# Patient Record
Sex: Female | Born: 1940 | Race: White | Hispanic: No | Marital: Married | Smoking: Former smoker
Health system: Southern US, Community
[De-identification: ages and names within clinical notes are randomized; demographics above are authoritative.]

## PROBLEM LIST (undated history)

## (undated) DIAGNOSIS — N289 Disorder of kidney and ureter, unspecified: Secondary | ICD-10-CM

## (undated) DIAGNOSIS — G629 Polyneuropathy, unspecified: Secondary | ICD-10-CM

## (undated) DIAGNOSIS — I10 Essential (primary) hypertension: Secondary | ICD-10-CM

## (undated) DIAGNOSIS — E785 Hyperlipidemia, unspecified: Secondary | ICD-10-CM

## (undated) DIAGNOSIS — M4800 Spinal stenosis, site unspecified: Secondary | ICD-10-CM

## (undated) HISTORY — PX: LITHOTRIPSY: SUR834

## (undated) HISTORY — DX: Polyneuropathy, unspecified: G62.9

## (undated) HISTORY — PX: CATARACT EXTRACTION: SUR2

## (undated) HISTORY — DX: Spinal stenosis, site unspecified: M48.00

## (undated) HISTORY — DX: Essential (primary) hypertension: I10

## (undated) HISTORY — DX: Hyperlipidemia, unspecified: E78.5

## (undated) HISTORY — PX: VAGINAL HYSTERECTOMY: SUR661

---

## 2014-06-09 DIAGNOSIS — M71152 Other infective bursitis, left hip: Secondary | ICD-10-CM | POA: Diagnosis not present

## 2014-06-09 DIAGNOSIS — M1612 Unilateral primary osteoarthritis, left hip: Secondary | ICD-10-CM | POA: Diagnosis not present

## 2014-06-09 DIAGNOSIS — H02831 Dermatochalasis of right upper eyelid: Secondary | ICD-10-CM | POA: Diagnosis not present

## 2014-06-09 DIAGNOSIS — H029 Unspecified disorder of eyelid: Secondary | ICD-10-CM | POA: Diagnosis not present

## 2014-06-09 DIAGNOSIS — H02834 Dermatochalasis of left upper eyelid: Secondary | ICD-10-CM | POA: Diagnosis not present

## 2014-06-14 DIAGNOSIS — M25551 Pain in right hip: Secondary | ICD-10-CM | POA: Diagnosis not present

## 2014-06-14 DIAGNOSIS — R2681 Unsteadiness on feet: Secondary | ICD-10-CM | POA: Diagnosis not present

## 2014-06-14 DIAGNOSIS — M7062 Trochanteric bursitis, left hip: Secondary | ICD-10-CM | POA: Diagnosis not present

## 2014-06-14 DIAGNOSIS — M6281 Muscle weakness (generalized): Secondary | ICD-10-CM | POA: Diagnosis not present

## 2014-06-23 DIAGNOSIS — R2681 Unsteadiness on feet: Secondary | ICD-10-CM | POA: Diagnosis not present

## 2014-06-23 DIAGNOSIS — M25551 Pain in right hip: Secondary | ICD-10-CM | POA: Diagnosis not present

## 2014-06-23 DIAGNOSIS — M7062 Trochanteric bursitis, left hip: Secondary | ICD-10-CM | POA: Diagnosis not present

## 2014-06-23 DIAGNOSIS — M6281 Muscle weakness (generalized): Secondary | ICD-10-CM | POA: Diagnosis not present

## 2014-06-28 DIAGNOSIS — R2681 Unsteadiness on feet: Secondary | ICD-10-CM | POA: Diagnosis not present

## 2014-06-28 DIAGNOSIS — M6281 Muscle weakness (generalized): Secondary | ICD-10-CM | POA: Diagnosis not present

## 2014-06-28 DIAGNOSIS — M25551 Pain in right hip: Secondary | ICD-10-CM | POA: Diagnosis not present

## 2014-06-28 DIAGNOSIS — M7062 Trochanteric bursitis, left hip: Secondary | ICD-10-CM | POA: Diagnosis not present

## 2014-06-29 DIAGNOSIS — J189 Pneumonia, unspecified organism: Secondary | ICD-10-CM | POA: Diagnosis not present

## 2014-06-29 DIAGNOSIS — J209 Acute bronchitis, unspecified: Secondary | ICD-10-CM | POA: Diagnosis not present

## 2014-06-29 DIAGNOSIS — J449 Chronic obstructive pulmonary disease, unspecified: Secondary | ICD-10-CM | POA: Diagnosis not present

## 2014-07-07 DIAGNOSIS — R2681 Unsteadiness on feet: Secondary | ICD-10-CM | POA: Diagnosis not present

## 2014-07-07 DIAGNOSIS — M7062 Trochanteric bursitis, left hip: Secondary | ICD-10-CM | POA: Diagnosis not present

## 2014-07-07 DIAGNOSIS — M25551 Pain in right hip: Secondary | ICD-10-CM | POA: Diagnosis not present

## 2014-07-07 DIAGNOSIS — M6281 Muscle weakness (generalized): Secondary | ICD-10-CM | POA: Diagnosis not present

## 2014-07-14 DIAGNOSIS — R2681 Unsteadiness on feet: Secondary | ICD-10-CM | POA: Diagnosis not present

## 2014-07-14 DIAGNOSIS — M7062 Trochanteric bursitis, left hip: Secondary | ICD-10-CM | POA: Diagnosis not present

## 2014-07-14 DIAGNOSIS — M6281 Muscle weakness (generalized): Secondary | ICD-10-CM | POA: Diagnosis not present

## 2014-07-14 DIAGNOSIS — M25551 Pain in right hip: Secondary | ICD-10-CM | POA: Diagnosis not present

## 2014-07-19 DIAGNOSIS — R2681 Unsteadiness on feet: Secondary | ICD-10-CM | POA: Diagnosis not present

## 2014-07-19 DIAGNOSIS — M7062 Trochanteric bursitis, left hip: Secondary | ICD-10-CM | POA: Diagnosis not present

## 2014-07-19 DIAGNOSIS — M6281 Muscle weakness (generalized): Secondary | ICD-10-CM | POA: Diagnosis not present

## 2014-07-19 DIAGNOSIS — M25551 Pain in right hip: Secondary | ICD-10-CM | POA: Diagnosis not present

## 2014-07-22 DIAGNOSIS — M7062 Trochanteric bursitis, left hip: Secondary | ICD-10-CM | POA: Diagnosis not present

## 2014-07-22 DIAGNOSIS — M25551 Pain in right hip: Secondary | ICD-10-CM | POA: Diagnosis not present

## 2014-07-22 DIAGNOSIS — M6281 Muscle weakness (generalized): Secondary | ICD-10-CM | POA: Diagnosis not present

## 2014-07-22 DIAGNOSIS — R2681 Unsteadiness on feet: Secondary | ICD-10-CM | POA: Diagnosis not present

## 2014-07-27 HISTORY — PX: EYE SURGERY: SHX253

## 2014-07-28 DIAGNOSIS — M7062 Trochanteric bursitis, left hip: Secondary | ICD-10-CM | POA: Diagnosis not present

## 2014-07-28 DIAGNOSIS — R2681 Unsteadiness on feet: Secondary | ICD-10-CM | POA: Diagnosis not present

## 2014-07-28 DIAGNOSIS — M6281 Muscle weakness (generalized): Secondary | ICD-10-CM | POA: Diagnosis not present

## 2014-07-28 DIAGNOSIS — M25551 Pain in right hip: Secondary | ICD-10-CM | POA: Diagnosis not present

## 2014-08-02 DIAGNOSIS — M7062 Trochanteric bursitis, left hip: Secondary | ICD-10-CM | POA: Diagnosis not present

## 2014-08-02 DIAGNOSIS — R2681 Unsteadiness on feet: Secondary | ICD-10-CM | POA: Diagnosis not present

## 2014-08-02 DIAGNOSIS — M6281 Muscle weakness (generalized): Secondary | ICD-10-CM | POA: Diagnosis not present

## 2014-08-02 DIAGNOSIS — M25551 Pain in right hip: Secondary | ICD-10-CM | POA: Diagnosis not present

## 2014-08-03 DIAGNOSIS — H04562 Stenosis of left lacrimal punctum: Secondary | ICD-10-CM | POA: Diagnosis not present

## 2014-08-03 DIAGNOSIS — H029 Unspecified disorder of eyelid: Secondary | ICD-10-CM | POA: Diagnosis not present

## 2014-08-04 DIAGNOSIS — L728 Other follicular cysts of the skin and subcutaneous tissue: Secondary | ICD-10-CM | POA: Diagnosis not present

## 2014-08-09 DIAGNOSIS — R2681 Unsteadiness on feet: Secondary | ICD-10-CM | POA: Diagnosis not present

## 2014-08-09 DIAGNOSIS — M25551 Pain in right hip: Secondary | ICD-10-CM | POA: Diagnosis not present

## 2014-08-09 DIAGNOSIS — M7062 Trochanteric bursitis, left hip: Secondary | ICD-10-CM | POA: Diagnosis not present

## 2014-08-09 DIAGNOSIS — M6281 Muscle weakness (generalized): Secondary | ICD-10-CM | POA: Diagnosis not present

## 2014-08-11 DIAGNOSIS — R2681 Unsteadiness on feet: Secondary | ICD-10-CM | POA: Diagnosis not present

## 2014-08-11 DIAGNOSIS — M6281 Muscle weakness (generalized): Secondary | ICD-10-CM | POA: Diagnosis not present

## 2014-08-11 DIAGNOSIS — M25551 Pain in right hip: Secondary | ICD-10-CM | POA: Diagnosis not present

## 2014-08-11 DIAGNOSIS — M7062 Trochanteric bursitis, left hip: Secondary | ICD-10-CM | POA: Diagnosis not present

## 2014-08-16 DIAGNOSIS — M25551 Pain in right hip: Secondary | ICD-10-CM | POA: Diagnosis not present

## 2014-08-16 DIAGNOSIS — M7062 Trochanteric bursitis, left hip: Secondary | ICD-10-CM | POA: Diagnosis not present

## 2014-08-16 DIAGNOSIS — M6281 Muscle weakness (generalized): Secondary | ICD-10-CM | POA: Diagnosis not present

## 2014-08-16 DIAGNOSIS — R2681 Unsteadiness on feet: Secondary | ICD-10-CM | POA: Diagnosis not present

## 2014-08-24 DIAGNOSIS — M719 Bursopathy, unspecified: Secondary | ICD-10-CM | POA: Diagnosis not present

## 2014-09-23 DIAGNOSIS — H04562 Stenosis of left lacrimal punctum: Secondary | ICD-10-CM | POA: Diagnosis not present

## 2014-09-23 DIAGNOSIS — H029 Unspecified disorder of eyelid: Secondary | ICD-10-CM | POA: Diagnosis not present

## 2014-09-27 DIAGNOSIS — E109 Type 1 diabetes mellitus without complications: Secondary | ICD-10-CM | POA: Diagnosis not present

## 2014-09-27 DIAGNOSIS — I10 Essential (primary) hypertension: Secondary | ICD-10-CM | POA: Diagnosis not present

## 2014-09-27 DIAGNOSIS — R739 Hyperglycemia, unspecified: Secondary | ICD-10-CM | POA: Diagnosis not present

## 2014-09-27 DIAGNOSIS — Z79899 Other long term (current) drug therapy: Secondary | ICD-10-CM | POA: Diagnosis not present

## 2014-09-30 DIAGNOSIS — E785 Hyperlipidemia, unspecified: Secondary | ICD-10-CM | POA: Diagnosis not present

## 2014-09-30 DIAGNOSIS — R739 Hyperglycemia, unspecified: Secondary | ICD-10-CM | POA: Diagnosis not present

## 2014-09-30 DIAGNOSIS — N289 Disorder of kidney and ureter, unspecified: Secondary | ICD-10-CM | POA: Diagnosis not present

## 2014-09-30 DIAGNOSIS — I10 Essential (primary) hypertension: Secondary | ICD-10-CM | POA: Diagnosis not present

## 2014-09-30 DIAGNOSIS — J40 Bronchitis, not specified as acute or chronic: Secondary | ICD-10-CM | POA: Diagnosis not present

## 2014-12-23 DIAGNOSIS — M546 Pain in thoracic spine: Secondary | ICD-10-CM | POA: Diagnosis not present

## 2015-02-10 ENCOUNTER — Ambulatory Visit (INDEPENDENT_AMBULATORY_CARE_PROVIDER_SITE_OTHER): Payer: Medicare Other | Admitting: Family Medicine

## 2015-02-10 ENCOUNTER — Encounter: Payer: Self-pay | Admitting: Family Medicine

## 2015-02-10 VITALS — BP 138/80 | HR 86 | Ht 67.0 in | Wt 156.0 lb

## 2015-02-10 DIAGNOSIS — E785 Hyperlipidemia, unspecified: Secondary | ICD-10-CM

## 2015-02-10 DIAGNOSIS — I10 Essential (primary) hypertension: Secondary | ICD-10-CM | POA: Diagnosis not present

## 2015-02-10 DIAGNOSIS — M4806 Spinal stenosis, lumbar region: Secondary | ICD-10-CM | POA: Diagnosis not present

## 2015-02-10 DIAGNOSIS — M5090 Cervical disc disorder, unspecified, unspecified cervical region: Secondary | ICD-10-CM | POA: Diagnosis not present

## 2015-02-10 DIAGNOSIS — M48061 Spinal stenosis, lumbar region without neurogenic claudication: Secondary | ICD-10-CM

## 2015-02-10 DIAGNOSIS — M8580 Other specified disorders of bone density and structure, unspecified site: Secondary | ICD-10-CM

## 2015-02-10 MED ORDER — GABAPENTIN 300 MG PO CAPS: 300.0000 mg | ORAL_CAPSULE | Freq: Every day | ORAL | Status: DC

## 2015-02-10 MED ORDER — AMLODIPINE BESYLATE-VALSARTAN 10-160 MG PO TABS: 1.0000 | ORAL_TABLET | Freq: Every day | ORAL | Status: DC

## 2015-02-10 MED ORDER — PRAVASTATIN SODIUM 80 MG PO TABS: 80.0000 mg | ORAL_TABLET | Freq: Every day | ORAL | Status: DC

## 2015-02-10 NOTE — Progress Notes (Signed)
Name: Traci Bowen   MRN: [id number]    DOB: 03/08/1941   Date:02/10/2015       Progress Note  Subjective  Chief Complaint  Chief Complaint  Patient presents with  . Establish Care    moved back to area    Anxiety Onset was 6 to 12 months ago. The problem has been gradually improving. Symptoms include depressed mood, excessive worry and nervous/anxious behavior. Patient reports no chest pain, compulsions, confusion, decreased concentration, dizziness, dry mouth, feeling of choking, hyperventilation, impotence, insomnia, irritability, malaise, muscle tension, nausea, obsessions, palpitations, panic, restlessness, shortness of breath or suicidal ideas. Symptoms occur most days. The severity of symptoms is mild. The symptoms are aggravated by family issues.   Risk factors include a major life event. There is no history of suicide attempts. Past treatments include nothing.  Hypertension This is a chronic problem. The current episode started more than 1 year ago. The problem has been gradually improving since onset. The problem is controlled. Associated symptoms include anxiety. Pertinent negatives include no blurred vision, chest pain, headaches, malaise/fatigue, neck pain, orthopnea, palpitations, peripheral edema, PND, shortness of breath or sweats. Past treatments include ACE inhibitors and calcium channel blockers. There are no compliance problems.  There is no history of angina, kidney disease, CAD/MI, CVA, heart failure, left ventricular hypertrophy, PVD, renovascular disease or retinopathy. There is no history of chronic renal disease.  Hyperlipidemia This is a chronic problem. The current episode started more than 1 year ago. The problem is controlled. She has no history of chronic renal disease. Pertinent negatives include no chest pain, focal weakness, myalgias or shortness of breath. Current antihyperlipidemic treatment includes diet change and statins. There are no compliance problems.    Neck Pain  This is a chronic problem. The current episode started more than 1 year ago. The problem has been waxing and waning. Associated with: s/p surgery. The pain is present in the right side. The quality of the pain is described as burning. Pertinent negatives include no chest pain, fever, headaches, paresis, tingling or weight loss.    No problem-specific assessment & plan notes found for this encounter.   Past Medical History  Diagnosis Date  . Spinal stenosis   . Hyperlipidemia   . Neuropathy   . Hypertension     Past Surgical History  Procedure Laterality Date  . Vaginal hysterectomy    . Cataract extraction Bilateral     Family History  Problem Relation Age of Onset  . Diabetes Mother   . Hypertension Mother   . Cancer Father   . Cancer Brother     Social History   Social History  . Marital Status: Widowed    Spouse Name: N/A  . Number of Children: N/A  . Years of Education: N/A   Occupational History  . Not on file.   Social History Main Topics  . Smoking status: Former Research scientist (life sciences)  . Smokeless tobacco: Not on file  . Alcohol Use: No  . Drug Use: No  . Sexual Activity: No   Other Topics Concern  . Not on file   Social History Narrative  . No narrative on file    Allergies  Allergen Reactions  . Iodinated Diagnostic Agents   . Penicillins      Review of Systems  Constitutional: Negative for fever, chills, weight loss, malaise/fatigue and irritability.  HENT: Negative for ear discharge, ear pain and sore throat.   Eyes: Negative for blurred vision.  Respiratory: Negative for cough, sputum  production, shortness of breath and wheezing.   Cardiovascular: Negative for chest pain, palpitations, orthopnea, leg swelling and PND.  Gastrointestinal: Negative for heartburn, nausea, abdominal pain, diarrhea, constipation, blood in stool and melena.  Genitourinary: Negative for dysuria, urgency, frequency, hematuria and impotence.  Musculoskeletal:  Negative for myalgias, back pain, joint pain and neck pain.  Skin: Negative for rash.  Neurological: Negative for dizziness, tingling, sensory change, focal weakness and headaches.  Endo/Heme/Allergies: Negative for environmental allergies and polydipsia. Does not bruise/bleed easily.  Psychiatric/Behavioral: Negative for depression, suicidal ideas, confusion and decreased concentration. The patient is nervous/anxious. The patient does not have insomnia.      Objective  Filed Vitals:   02/10/15 1417  BP: 138/80  Pulse: 86  Height: 5\' 7"  (1.702 m)  Weight: 156 lb (70.761 kg)    Physical Exam  Constitutional: She is well-developed, well-nourished, and in no distress. No distress.  HENT:  Head: Normocephalic and atraumatic.  Right Ear: External ear normal.  Left Ear: External ear normal.  Nose: Nose normal.  Mouth/Throat: Oropharynx is clear and moist.  Eyes: Conjunctivae and EOM are normal. Pupils are equal, round, and reactive to light. Right eye exhibits no discharge. Left eye exhibits no discharge.  Neck: Normal range of motion. Neck supple. No JVD present. No thyromegaly present.  Cardiovascular: Normal rate, regular rhythm, normal heart sounds and intact distal pulses.  Exam reveals no gallop and no friction rub.   No murmur heard. Pulmonary/Chest: Effort normal and breath sounds normal.  Abdominal: Soft. Bowel sounds are normal. She exhibits no mass. There is no tenderness. There is no guarding.  Musculoskeletal: Normal range of motion. She exhibits no edema.  Lymphadenopathy:    She has no cervical adenopathy.  Neurological: She is alert. She has normal reflexes.  Skin: Skin is warm and dry. She is not diaphoretic.  Psychiatric: Mood and affect normal.      Assessment & Plan  Problem List Items Addressed This Visit    None    Visit Diagnoses    Essential hypertension    -  Primary    Relevant Medications    amLODipine-valsartan (EXFORGE) 10-160 MG per tablet     pravastatin (PRAVACHOL) 80 MG tablet    Hyperlipidemia        Relevant Medications    amLODipine-valsartan (EXFORGE) 10-160 MG per tablet    pravastatin (PRAVACHOL) 80 MG tablet    Spinal stenosis of lumbar region        Relevant Medications    gabapentin (NEURONTIN) 300 MG capsule    Cervical disc disease        Osteopenia             Dr. Trystian Crisanto [facility]  02/10/2015

## 2015-03-28 DIAGNOSIS — H02831 Dermatochalasis of right upper eyelid: Secondary | ICD-10-CM | POA: Diagnosis not present

## 2015-03-28 DIAGNOSIS — H02834 Dermatochalasis of left upper eyelid: Secondary | ICD-10-CM | POA: Diagnosis not present

## 2015-03-28 DIAGNOSIS — H029 Unspecified disorder of eyelid: Secondary | ICD-10-CM | POA: Diagnosis not present

## 2015-04-08 DIAGNOSIS — Z23 Encounter for immunization: Secondary | ICD-10-CM | POA: Diagnosis not present

## 2015-04-27 ENCOUNTER — Encounter: Payer: Self-pay | Admitting: Emergency Medicine

## 2015-04-27 ENCOUNTER — Ambulatory Visit
Admission: EM | Admit: 2015-04-27 | Discharge: 2015-04-27 | Disposition: A | Discharge status: Home / Self Care | Payer: Medicare Other | Attending: Family Medicine | Admitting: Family Medicine

## 2015-04-27 DIAGNOSIS — M7552 Bursitis of left shoulder: Secondary | ICD-10-CM

## 2015-04-27 HISTORY — DX: Disorder of kidney and ureter, unspecified: N28.9

## 2015-04-27 MED ORDER — MELOXICAM 7.5 MG PO TABS: 7.5000 mg | ORAL_TABLET | Freq: Every day | ORAL | Status: DC

## 2015-04-27 MED ORDER — TRAMADOL HCL 50 MG PO TABS: 50.0000 mg | ORAL_TABLET | Freq: Three times a day (TID) | ORAL | Status: DC | PRN

## 2015-04-27 NOTE — ED Notes (Signed)
Pt reports Left shoulder pain, bursitis, has had it before. Tender to touch. Has seen it an orthopedist before for this ,took some ibuprofen but didn't help.

## 2015-04-27 NOTE — ED Provider Notes (Signed)
[facility]  ____________________________________________  Time seen: Approximately 5:39 PM  I have reviewed the triage vital signs and the nursing notes.   HISTORY  Chief Complaint Shoulder Pain  HPI Traci Bowen is a 74 y.o. female presents for complaints of gradual onset of left shoulder pain x 4-5 days. States pain is point tender. States has had history of similar in past which required orthopedic injection for bursitis in left shoulder. States when not moving arm or touching arm, the shoulder does not hurt. Denies chest pain, abdominal pain, shortness of breath, weakness. Denies pain radiation. States the pain does not radiate to chest or arm. Denies numbness or tingling sensation. Denies decrease hand grips. States that pain was worse this morning when she got up. States pain now hurts for her to lift her arm up.  Again reports that when sitting still and not using her arm she does not have any pain.  Patient denies fall, trauma or direct injury.Reports history of similar in past. Denies history of falls at home.   States current pain is 5 out of 10 aching. Reports she is right-hand dominant. Again patient reports pain is fully reproducible by palpation and movement. States unrelieved by one dose of ibuprofen this am.  Primary care physician: Dr. Ronnald Ramp. Reports she has an appointment with Dr. Ronnald Ramp this coming Monday.   Past Medical History  Diagnosis Date  . Spinal stenosis   . Hyperlipidemia   . Neuropathy ([place])   . Hypertension   . Renal disorder   Left shoulder bursitis. Bilateral wrist bursitis.  There are no active problems to display for this patient.   Past Surgical History  Procedure Laterality Date  . Vaginal hysterectomy    . Cataract extraction Bilateral   . Lithotripsy    . Eye surgery Left march 2016    cysts removed     Current Outpatient Rx  Name  Route  Sig  Dispense  Refill  . amLODipine-valsartan (EXFORGE) 10-160 MG per tablet   Oral    Take 1 tablet by mouth daily.   30 tablet   6   . Calcium Carb-Cholecalciferol (CALCIUM PLUS VITAMIN D3) 600-800 MG-UNIT TABS   Oral   Take 1 capsule by mouth daily at 6 (six) AM.         . Cholecalciferol (VITAMIN D-3 PO)   Oral   Take 2 capsules by mouth daily at 6 (six) AM.         . gabapentin (NEURONTIN) 300 MG capsule   Oral   Take 1 capsule (300 mg total) by mouth at bedtime.   30 capsule   6   . Multiple Vitamins-Minerals (CENTRUM SILVER PO)   Oral   Take 1 capsule by mouth daily at 6 (six) AM.         . Omega-3 Fatty Acids (FISH OIL) 1000 MG CAPS   Oral   Take 1 capsule by mouth 2 (two) times daily.         . pravastatin (PRAVACHOL) 80 MG tablet   Oral   Take 1 tablet (80 mg total) by mouth at bedtime.   30 tablet   6   . vitamin B-12 (CYANOCOBALAMIN) 1000 MCG tablet   Oral   Take 1,000 mcg by mouth daily.           Allergies Iodinated diagnostic agents and Penicillins  Family History  Problem Relation Age of Onset  . Diabetes Mother   . Hypertension Mother   .  Cancer Father   . Cancer Brother     Social History Social History  Substance Use Topics  . Smoking status: Former Research scientist (life sciences)  . Smokeless tobacco: None  . Alcohol Use: No    Review of Systems Constitutional: No fever/chills Eyes: No visual changes. ENT: No sore throat. Cardiovascular: Denies chest pain. Respiratory: Denies shortness of breath. Gastrointestinal: No abdominal pain.  No nausea, no vomiting.  No diarrhea.  No constipation. Genitourinary: Negative for dysuria. Musculoskeletal: Negative for back pain. Left shoulder pain.  Skin: Negative for rash. Neurological: Negative for headaches, focal weakness or numbness.  10-point ROS otherwise negative.  ____________________________________________   PHYSICAL EXAM:  VITAL SIGNS: ED Triage Vitals  Enc Vitals Group     BP 04/27/15 1556 130/58 mmHg     Pulse Rate 04/27/15 1556 75     Resp 04/27/15 1556 18     Temp  04/27/15 1556 98 F (36.7 C)     Temp Source 04/27/15 1556 Oral     SpO2 04/27/15 1556 97 %     Weight 04/27/15 1556 157 lb (71.215 kg)     Height 04/27/15 1556 5' 6.5" (1.689 m)     Head Cir --      Peak Flow --      Pain Score 04/27/15 1600 5     Pain Loc --      Pain Edu? --      Excl. in [place]? --     Constitutional: Alert and oriented. Well appearing and in no acute distress. Eyes: Conjunctivae are normal. PERRL. EOMI. Head: Atraumatic.  Nose: No congestion/rhinnorhea.  Mouth/Throat: Mucous membranes are moist.   Neck: No stridor.  No cervical spine tenderness to palpation. Hematological/Lymphatic/Immunilogical: No cervical lymphadenopathy. Cardiovascular: Normal rate, regular rhythm. Grossly normal heart sounds.  Good peripheral circulation. Respiratory: Normal respiratory effort.  No retractions. Lungs CTAB.No wheezes, rales or rhonchi. Good air movement.  Gastrointestinal: Soft and nontender. No distention. Normal Bowel sounds.  No abdominal bruits. No CVA tenderness. Musculoskeletal: No lower or upper extremity tenderness nor edema.  No joint effusions. Bilateral pedal pulses equal and easily palpated.  Except: left anterior shoulder and proximal humeral head mod TTP, no ecchymosis, no swelling, no erythema, pain with abduction at 45 degrees, bilateral hand grips equal, distal radial pulses equal bilaterally, sensation intact to bilateral upper extremities, chest nontender, left upper extremity otherwise nontender.  Neurologic:  Normal speech and language. No gross focal neurologic deficits are appreciated. No gait instability. Skin:  Skin is warm, dry and intact. No rash noted. Psychiatric: Mood and affect are normal. Speech and behavior are normal.  ____________________________________________   LABS (all labs ordered are listed, but only abnormal results are displayed)  Labs Reviewed - No data to display   INITIAL IMPRESSION / ASSESSMENT AND PLAN / ED  COURSE  Pertinent labs & imaging results that were available during my care of the patient were reviewed by me and considered in my medical decision making (see chart for details).  Very well-appearing patient. No acute distress. Presents for the complaints of gradual onset of left shoulder pain. Reports pain is only with direct palpation or movement. Denies chest pain, shortness of breath, fall, trauma or other injury. Left shoulder point tender with pain fully reproducible per patient by palpation to left anterior shoulder and proximal humeral head. Suspect bursitis. As no trauma or direct injury will not evaluate x-ray at this time. Will treat supportively with sling and also discussed and demonstrated exercises for left  shoulder multiple times per day including pendulum exercises. Will place and sling for support but also discussed to continue exercising. Encouraged close follow-up. Will treat with daily Mobic as well as when necessary tramadol for breakthrough pain. Ortho on call information also given.   Discussed follow up with Primary care physician this week. Discussed follow up and return parameters including no resolution or any worsening concerns. Patient verbalized understanding and agreed to plan.   ____________________________________________   FINAL CLINICAL IMPRESSION(S) / ED DIAGNOSES  Final diagnoses:  Shoulder bursitis, left       Marylene Land, NP 04/27/15 1941

## 2015-04-27 NOTE — Discharge Instructions (Signed)
Take medication as prescribed. Apply ice. Wear sling. Stretch arm as discussed and demonstrated multiple times per day.    Follow up with your primary care physician on Monday as scheduled. Follow up with orthopedic next week as needed for continued pain.Return to Urgent care for new or worsening concerns.   Bursitis Bursitis is inflammation and irritation of a bursa, which is one of the small, fluid-filled sacs that cushion and protect the moving parts of your body. These sacs are located between bones and muscles, muscle attachments, or skin areas next to bones. A bursa protects these structures from the wear and tear that results from frequent movement. An inflamed bursa causes pain and swelling. Fluid may build up inside the sac. Bursitis is most common near joints, especially the knees, elbows, hips, and shoulders. CAUSES Bursitis can be caused by:   Injury from:  A direct blow, like falling on your knee or elbow.  Overuse of a joint (repetitive stress).  Infection. This can happen if bacteria gets into a bursa through a cut or scrape near a joint.  Diseases that cause joint inflammation, such as gout and rheumatoid arthritis. RISK FACTORS You may be at risk for bursitis if you:   Have a job or hobby that involves a lot of repetitive stress on your joints.  Have a condition that weakens your body's defense system (immune system), such as diabetes, cancer, or HIV.  Lift and reach overhead often.  Kneel or lean on hard surfaces often.  Run or walk often. SIGNS AND SYMPTOMS The most common signs and symptoms of bursitis are:  Pain that gets worse when you move the affected body part or put weight on it.  Inflammation.  Stiffness. Other signs and symptoms may include:  Redness.  Tenderness.  Warmth.  Pain that continues after rest.  Fever and chills. This may occur in bursitis caused by infection. DIAGNOSIS Bursitis may be diagnosed by:   Medical history and  physical exam.  MRI.  A procedure to drain fluid from the bursa with a needle (aspiration). The fluid may be checked for signs of infection or gout.  Blood tests to rule out other causes of inflammation. TREATMENT  Bursitis can usually be treated at home with rest, ice, compression, and elevation (RICE). For mild bursitis, RICE treatment may be all you need. Other treatments may include:  Nonsteroidal anti-inflammatory drugs (NSAIDs) to treat pain and inflammation.  Corticosteroids to fight inflammation. You may have these drugs injected into and around the area of bursitis.  Aspiration of bursitis fluid to relieve pain and improve movement.  Antibiotic medicine to treat an infected bursa.  A splint, brace, or walking aid.  Physical therapy if you continue to have pain or limited movement.  Surgery to remove a damaged or infected bursa. This may be needed if you have a very bad case of bursitis or if other treatments have not worked. HOME CARE INSTRUCTIONS   Take medicines only as directed by your health care provider.  If you were prescribed an antibiotic medicine, finish it all even if you start to feel better.  Rest the affected area as directed by your health care provider.  Keep the area elevated.  Avoid activities that make pain worse.  Apply ice to the injured area:  Place ice in a plastic bag.  Place a towel between your skin and the bag.  Leave the ice on for 20 minutes, 2-3 times a day.  Use splints, braces, pads, or walking  aids as directed by your health care provider.  Keep all follow-up visits as directed by your health care provider. This is important. PREVENTION   Wear knee pads if you kneel often.  Wear sturdy running or walking shoes that fit you well.  Take regular breaks from repetitive activity.  Warm up by stretching before doing any strenuous activity.  Maintain a healthy weight or lose weight as recommended by your health care provider.  Ask your health care provider if you need help.  Exercise regularly. Start any new physical activity gradually. SEEK MEDICAL CARE IF:   Your bursitis is not responding to treatment or home care.  You have a fever.  You have chills.   This information is not intended to replace advice given to you by your health care provider. Make sure you discuss any questions you have with your health care provider.   Document Released: 05/11/2000 Document Revised: 02/02/2015 Document Reviewed: 08/03/2013 Elsevier Interactive Patient Education [employer].

## 2015-05-02 ENCOUNTER — Encounter: Payer: Self-pay | Admitting: Family Medicine

## 2015-05-02 ENCOUNTER — Ambulatory Visit (INDEPENDENT_AMBULATORY_CARE_PROVIDER_SITE_OTHER): Payer: Medicare Other | Admitting: Family Medicine

## 2015-05-02 VITALS — BP 122/78 | HR 64 | Ht 66.0 in | Wt 162.0 lb

## 2015-05-02 DIAGNOSIS — E785 Hyperlipidemia, unspecified: Secondary | ICD-10-CM

## 2015-05-02 DIAGNOSIS — I10 Essential (primary) hypertension: Secondary | ICD-10-CM

## 2015-05-02 MED ORDER — VALSARTAN 160 MG PO TABS: 160.0000 mg | ORAL_TABLET | Freq: Every day | ORAL | Status: DC

## 2015-05-02 MED ORDER — AMLODIPINE BESYLATE 10 MG PO TABS: 10.0000 mg | ORAL_TABLET | Freq: Every day | ORAL | Status: DC

## 2015-05-02 MED ORDER — SIMVASTATIN 40 MG PO TABS: 40.0000 mg | ORAL_TABLET | Freq: Every day | ORAL | Status: DC

## 2015-05-02 NOTE — Progress Notes (Signed)
Name: Traci Bowen   MRN: [id number]    DOB: [birth year]   Date:05/02/2015       Progress Note  Subjective  Chief Complaint  Chief Complaint  Patient presents with  . Hypertension    ins wants her to be switched from Exforge to a cheaper med  . Hyperlipidemia    ins wants to be switched from Prav to a cheaper med    Hypertension This is a chronic problem. The current episode started more than 1 year ago. The problem has been waxing and waning since onset. The problem is controlled. Pertinent negatives include no anxiety, blurred vision, chest pain, headaches, malaise/fatigue, neck pain, orthopnea, palpitations, peripheral edema, PND, shortness of breath or sweats. There are no associated agents to hypertension. There are no known risk factors for coronary artery disease. Past treatments include calcium channel blockers and angiotensin blockers. The current treatment provides moderate improvement. There are no compliance problems.  There is no history of angina, kidney disease, CAD/MI, CVA, heart failure, left ventricular hypertrophy, PVD, renovascular disease or retinopathy. There is no history of chronic renal disease or a hypertension causing med.  Hyperlipidemia This is a chronic problem. The current episode started more than 1 year ago. The problem is controlled. Recent lipid tests were reviewed and are normal. She has no history of chronic renal disease, diabetes, hypothyroidism, liver disease or obesity. There are no known factors aggravating her hyperlipidemia. Pertinent negatives include no chest pain, focal weakness, myalgias or shortness of breath. Current antihyperlipidemic treatment includes statins. The current treatment provides moderate improvement of lipids. There are no compliance problems.  There are no known risk factors for coronary artery disease.    No problem-specific assessment & plan notes found for this encounter.   Past Medical History  Diagnosis Date  . Spinal  stenosis   . Hyperlipidemia   . Neuropathy ([place])   . Hypertension   . Renal disorder     Past Surgical History  Procedure Laterality Date  . Vaginal hysterectomy    . Cataract extraction Bilateral   . Lithotripsy    . Eye surgery Left march 2016    cysts removed     Family History  Problem Relation Age of Onset  . Diabetes Mother   . Hypertension Mother   . Cancer Father   . Cancer Brother     Social History   Social History  . Marital Status: Widowed    Spouse Name: N/A  . Number of Children: N/A  . Years of Education: N/A   Occupational History  . Not on file.   Social History Main Topics  . Smoking status: Former Research scientist (life sciences)  . Smokeless tobacco: Not on file  . Alcohol Use: No  . Drug Use: No  . Sexual Activity: No   Other Topics Concern  . Not on file   Social History Narrative    Allergies  Allergen Reactions  . Iodinated Diagnostic Agents   . Penicillins      Review of Systems  Constitutional: Negative for fever, chills, weight loss and malaise/fatigue.  HENT: Negative for ear discharge, ear pain and sore throat.   Eyes: Negative for blurred vision.  Respiratory: Negative for cough, sputum production, shortness of breath and wheezing.   Cardiovascular: Negative for chest pain, palpitations, orthopnea, leg swelling and PND.  Gastrointestinal: Negative for heartburn, nausea, abdominal pain, diarrhea, constipation, blood in stool and melena.  Genitourinary: Negative for dysuria, urgency, frequency and hematuria.  Musculoskeletal: Negative for myalgias, back  pain, joint pain and neck pain.  Skin: Negative for rash.  Neurological: Negative for dizziness, tingling, sensory change, focal weakness and headaches.  Endo/Heme/Allergies: Negative for environmental allergies and polydipsia. Does not bruise/bleed easily.  Psychiatric/Behavioral: Negative for depression and suicidal ideas. The patient is not nervous/anxious and does not have insomnia.       Objective  Filed Vitals:   05/02/15 1011  BP: 122/78  Pulse: 64  Height: 5\' 6"  (1.676 m)  Weight: 162 lb (73.483 kg)    Physical Exam  Constitutional: She is well-developed, well-nourished, and in no distress. No distress.  HENT:  Head: Normocephalic and atraumatic.  Right Ear: External ear normal.  Left Ear: External ear normal.  Nose: Nose normal.  Mouth/Throat: Oropharynx is clear and moist.  Eyes: Conjunctivae and EOM are normal. Pupils are equal, round, and reactive to light. Right eye exhibits no discharge. Left eye exhibits no discharge.  Neck: Normal range of motion. Neck supple. No JVD present. No thyromegaly present.  Cardiovascular: Normal rate, regular rhythm, normal heart sounds and intact distal pulses.  Exam reveals no gallop and no friction rub.   No murmur heard. Pulmonary/Chest: Effort normal and breath sounds normal.  Abdominal: Soft. Bowel sounds are normal. She exhibits no mass. There is no tenderness. There is no guarding.  Musculoskeletal: Normal range of motion. She exhibits no edema.  Lymphadenopathy:    She has no cervical adenopathy.  Neurological: She is alert. She has normal reflexes.  Skin: Skin is warm and dry. She is not diaphoretic. No erythema.  Psychiatric: Mood and affect normal.      Assessment & Plan  Problem List Items Addressed This Visit    None    Visit Diagnoses    Hyperlipidemia    -  Primary    Relevant Medications    simvastatin (ZOCOR) 40 MG tablet    amLODipine (NORVASC) 10 MG tablet    valsartan (DIOVAN) 160 MG tablet    Essential hypertension        Relevant Medications    simvastatin (ZOCOR) 40 MG tablet    amLODipine (NORVASC) 10 MG tablet    valsartan (DIOVAN) 160 MG tablet         Dr. Abass Misener [facility]  05/02/2015

## 2015-06-01 ENCOUNTER — Encounter: Payer: Self-pay | Admitting: Family Medicine

## 2015-06-01 ENCOUNTER — Ambulatory Visit (INDEPENDENT_AMBULATORY_CARE_PROVIDER_SITE_OTHER): Payer: Medicare Other | Admitting: Family Medicine

## 2015-06-01 VITALS — BP 124/78 | HR 76 | Ht 66.0 in | Wt 166.0 lb

## 2015-06-01 DIAGNOSIS — I10 Essential (primary) hypertension: Secondary | ICD-10-CM

## 2015-06-01 DIAGNOSIS — E785 Hyperlipidemia, unspecified: Secondary | ICD-10-CM | POA: Diagnosis not present

## 2015-06-01 MED ORDER — AMLODIPINE BESYLATE 10 MG PO TABS: 10.0000 mg | ORAL_TABLET | Freq: Every day | ORAL | Status: DC

## 2015-06-01 NOTE — Progress Notes (Signed)
Name: Traci Bowen   MRN: [id number]    DOB: Nov 13, 1940   Date:06/01/2015       Progress Note  Subjective  Chief Complaint  Chief Complaint  Patient presents with  . Hypertension    added a B/P med to her regimen- recheck B/P    Hypertension This is a chronic problem. The current episode started more than 1 year ago. The problem has been gradually improving since onset. The problem is controlled. Pertinent negatives include no anxiety, blurred vision, chest pain, headaches, malaise/fatigue, neck pain, orthopnea, palpitations, peripheral edema, PND, shortness of breath or sweats. There are no associated agents to hypertension. Risk factors for coronary artery disease include dyslipidemia. Past treatments include angiotensin blockers and calcium channel blockers. The current treatment provides moderate improvement. There are no compliance problems.  There is no history of angina, kidney disease, CAD/MI, CVA, heart failure, left ventricular hypertrophy, PVD, renovascular disease or retinopathy. There is no history of chronic renal disease or a hypertension causing med.    No problem-specific assessment & plan notes found for this encounter.   Past Medical History  Diagnosis Date  . Spinal stenosis   . Hyperlipidemia   . Neuropathy ([place])   . Hypertension   . Renal disorder     Past Surgical History  Procedure Laterality Date  . Vaginal hysterectomy    . Cataract extraction Bilateral   . Lithotripsy    . Eye surgery Left march 2016    cysts removed     Family History  Problem Relation Age of Onset  . Diabetes Mother   . Hypertension Mother   . Cancer Father   . Cancer Brother     Social History   Social History  . Marital Status: Widowed    Spouse Name: N/A  . Number of Children: N/A  . Years of Education: N/A   Occupational History  . Not on file.   Social History Main Topics  . Smoking status: Former Research scientist (life sciences)  . Smokeless tobacco: Not on file  . Alcohol Use: No  .  Drug Use: No  . Sexual Activity: No   Other Topics Concern  . Not on file   Social History Narrative    Allergies  Allergen Reactions  . Iodinated Diagnostic Agents   . Penicillins      Review of Systems  Constitutional: Negative for fever, chills, weight loss and malaise/fatigue.  HENT: Negative for ear discharge, ear pain and sore throat.   Eyes: Negative for blurred vision.  Respiratory: Negative for cough, sputum production, shortness of breath and wheezing.   Cardiovascular: Negative for chest pain, palpitations, orthopnea, leg swelling and PND.  Gastrointestinal: Negative for heartburn, nausea, abdominal pain, diarrhea, constipation, blood in stool and melena.  Genitourinary: Negative for dysuria, urgency, frequency and hematuria.  Musculoskeletal: Negative for myalgias, back pain, joint pain and neck pain.  Skin: Negative for rash.  Neurological: Negative for dizziness, tingling, sensory change, focal weakness and headaches.  Endo/Heme/Allergies: Negative for environmental allergies and polydipsia. Does not bruise/bleed easily.  Psychiatric/Behavioral: Negative for depression and suicidal ideas. The patient is not nervous/anxious and does not have insomnia.      Objective  Filed Vitals:   06/01/15 0921  BP: 124/78  Pulse: 76  Height: 5\' 6"  (1.676 m)  Weight: 166 lb (75.297 kg)    Physical Exam  Constitutional: She is well-developed, well-nourished, and in no distress. No distress.  HENT:  Head: Normocephalic and atraumatic.  Right Ear: External ear normal.  Left Ear: External ear normal.  Nose: Nose normal.  Mouth/Throat: Oropharynx is clear and moist.  Eyes: Conjunctivae and EOM are normal. Pupils are equal, round, and reactive to light. Right eye exhibits no discharge. Left eye exhibits no discharge.  Neck: Normal range of motion. Neck supple. No JVD present. No thyromegaly present.  Cardiovascular: Normal rate, regular rhythm, normal heart sounds and  intact distal pulses.  Exam reveals no gallop and no friction rub.   No murmur heard. Pulmonary/Chest: Effort normal and breath sounds normal.  Abdominal: Soft. Bowel sounds are normal. She exhibits no mass. There is no tenderness. There is no guarding.  Musculoskeletal: Normal range of motion. She exhibits no edema.  Lymphadenopathy:    She has no cervical adenopathy.  Neurological: She is alert. She has normal reflexes.  Skin: Skin is warm and dry. She is not diaphoretic.  Psychiatric: Mood and affect normal.      Assessment & Plan  Problem List Items Addressed This Visit      Cardiovascular and Mediastinum   Essential hypertension - Primary   Relevant Medications   amLODipine (NORVASC) 10 MG tablet     Other   Hyperlipidemia   Relevant Medications   amLODipine (NORVASC) 10 MG tablet        Dr. Elycia [facility]  06/01/2015

## 2015-06-14 DIAGNOSIS — H029 Unspecified disorder of eyelid: Secondary | ICD-10-CM | POA: Diagnosis not present

## 2015-08-30 ENCOUNTER — Other Ambulatory Visit: Payer: Self-pay

## 2015-08-31 ENCOUNTER — Encounter: Payer: Self-pay | Admitting: Family Medicine

## 2015-08-31 ENCOUNTER — Ambulatory Visit (INDEPENDENT_AMBULATORY_CARE_PROVIDER_SITE_OTHER): Payer: Medicare Other | Admitting: Family Medicine

## 2015-08-31 VITALS — BP 138/62 | HR 72 | Ht 66.0 in | Wt 176.0 lb

## 2015-08-31 DIAGNOSIS — M10079 Idiopathic gout, unspecified ankle and foot: Secondary | ICD-10-CM

## 2015-08-31 DIAGNOSIS — Z01818 Encounter for other preprocedural examination: Secondary | ICD-10-CM

## 2015-08-31 DIAGNOSIS — R9431 Abnormal electrocardiogram [ECG] [EKG]: Secondary | ICD-10-CM | POA: Diagnosis not present

## 2015-08-31 DIAGNOSIS — I10 Essential (primary) hypertension: Secondary | ICD-10-CM | POA: Diagnosis not present

## 2015-08-31 DIAGNOSIS — M109 Gout, unspecified: Secondary | ICD-10-CM

## 2015-08-31 MED ORDER — ETODOLAC 400 MG PO TABS: 400.0000 mg | ORAL_TABLET | Freq: Two times a day (BID) | ORAL | Status: DC

## 2015-08-31 NOTE — Progress Notes (Signed)
Name: Traci Bowen   MRN: [id number]    DOB: [birth year]   Date:08/31/2015       Progress Note  Subjective  Chief Complaint  Chief Complaint  Patient presents with  . Procedure    surgery clearance for oral surgery    HPI Comments: Patient presents for clearance for oral surgery.  Leg Pain  The incident occurred 12 to 24 hours ago. There was no injury mechanism. The pain is present in the left ankle. The quality of the pain is described as aching. The pain is at a severity of 7/10. The pain is moderate. The pain has been intermittent since onset. Associated symptoms include an inability to bear weight and a loss of motion. Pertinent negatives include no loss of sensation, muscle weakness, numbness or tingling. The symptoms are aggravated by movement and palpation. She has tried NSAIDs for the symptoms. The treatment provided moderate relief.  Hypertension This is a chronic problem. The current episode started more than 1 year ago. The problem is unchanged. The problem is controlled. Associated symptoms include chest pain, palpitations and shortness of breath. Pertinent negatives include no anxiety, blurred vision, headaches, malaise/fatigue, neck pain, orthopnea, peripheral edema, PND or sweats. There are no associated agents to hypertension. Risk factors for coronary artery disease include obesity, dyslipidemia and post-menopausal state. Past treatments include angiotensin blockers and calcium channel blockers. The current treatment provides no improvement. There are no compliance problems.  There is no history of angina, kidney disease, CAD/MI, CVA, heart failure, left ventricular hypertrophy, PVD or renovascular disease. There is no history of chronic renal disease or a hypertension causing med.    No problem-specific assessment & plan notes found for this encounter.   Past Medical History  Diagnosis Date  . Spinal stenosis   . Hyperlipidemia   . Neuropathy ([place])   . Hypertension   .  Renal disorder     Past Surgical History  Procedure Laterality Date  . Vaginal hysterectomy    . Cataract extraction Bilateral   . Lithotripsy    . Eye surgery Left march 2016    cysts removed     Family History  Problem Relation Age of Onset  . Diabetes Mother   . Hypertension Mother   . Cancer Father   . Cancer Brother     Social History   Social History  . Marital Status: Widowed    Spouse Name: N/A  . Number of Children: N/A  . Years of Education: N/A   Occupational History  . Not on file.   Social History Main Topics  . Smoking status: Former Research scientist (life sciences)  . Smokeless tobacco: Not on file  . Alcohol Use: No  . Drug Use: No  . Sexual Activity: No   Other Topics Concern  . Not on file   Social History Narrative    Allergies  Allergen Reactions  . Iodinated Diagnostic Agents   . Penicillins      Review of Systems  Constitutional: Negative for fever, chills, weight loss and malaise/fatigue.  HENT: Negative for ear discharge, ear pain and sore throat.   Eyes: Negative for blurred vision.  Respiratory: Positive for shortness of breath. Negative for cough, sputum production and wheezing.   Cardiovascular: Positive for chest pain and palpitations. Negative for orthopnea, leg swelling and PND.  Gastrointestinal: Negative for heartburn, nausea, abdominal pain, diarrhea, constipation, blood in stool and melena.  Genitourinary: Negative for dysuria, urgency, frequency and hematuria.  Musculoskeletal: Negative for myalgias, back pain, joint pain  and neck pain.  Skin: Negative for rash.  Neurological: Negative for dizziness, tingling, sensory change, focal weakness, numbness and headaches.  Endo/Heme/Allergies: Negative for environmental allergies and polydipsia. Does not bruise/bleed easily.  Psychiatric/Behavioral: Negative for depression and suicidal ideas. The patient is not nervous/anxious and does not have insomnia.      Objective  Filed Vitals:   08/31/15  1010  BP: 138/62  Pulse: 72  Height: 5\' 6"  (1.676 m)  Weight: 176 lb (79.833 kg)    Physical Exam  Constitutional: She is well-developed, well-nourished, and in no distress. No distress.  HENT:  Head: Normocephalic and atraumatic.  Right Ear: External ear normal.  Left Ear: External ear normal.  Nose: Nose normal.  Mouth/Throat: Oropharynx is clear and moist.  Eyes: Conjunctivae and EOM are normal. Pupils are equal, round, and reactive to light. Right eye exhibits no discharge. Left eye exhibits no discharge.  Neck: Normal range of motion. Neck supple. No JVD present. No thyromegaly present.  Cardiovascular: Normal rate, regular rhythm, normal heart sounds and intact distal pulses.  Exam reveals no gallop and no friction rub.   No murmur heard. Pulmonary/Chest: Effort normal and breath sounds normal.  Abdominal: Soft. Bowel sounds are normal. She exhibits no mass. There is no tenderness. There is no guarding.  Musculoskeletal: Normal range of motion. She exhibits no edema.  Lymphadenopathy:    She has no cervical adenopathy.  Neurological: She is alert.  Skin: Skin is warm and dry. She is not diaphoretic.  Psychiatric: Mood and affect normal.  Nursing note and vitals reviewed.     Assessment & Plan  Problem List Items Addressed This Visit      Cardiovascular and Mediastinum   Essential hypertension   Relevant Orders   Renal Function Panel    Other Visit Diagnoses    Preop examination    -  Primary    Relevant Orders    EKG 12-Lead (Completed)    Ambulatory referral to Cardiology    Abnormal finding on EKG        Relevant Orders    Ambulatory referral to Cardiology    Acute gout of foot, unspecified cause, unspecified laterality        Relevant Medications    etodolac (LODINE) 400 MG tablet         Dr. Ellene Bloodsaw [facility]  08/31/2015

## 2015-09-01 LAB — RENAL FUNCTION PANEL
Albumin: 4.4 g/dL (ref 3.5–4.8)
BUN / CREAT RATIO: 15 (ref 12–28)
BUN: 10 mg/dL (ref 8–27)
CALCIUM: 10.6 mg/dL — AB (ref 8.7–10.3)
CO2: 24 mmol/L (ref 18–29)
CREATININE: 0.68 mg/dL (ref 0.57–1.00)
Chloride: 100 mmol/L (ref 96–106)
GFR calc Af Amer: 100 mL/min/{1.73_m2} (ref >59)
GFR, EST NON AFRICAN AMERICAN: 86 mL/min/{1.73_m2} (ref >59)
GLUCOSE: 83 mg/dL (ref 65–99)
Phosphorus: 3.5 mg/dL (ref 2.5–4.5)
Potassium: 4.3 mmol/L (ref 3.5–5.2)
SODIUM: 144 mmol/L (ref 134–144)

## 2015-09-05 DIAGNOSIS — R9431 Abnormal electrocardiogram [ECG] [EKG]: Secondary | ICD-10-CM | POA: Diagnosis not present

## 2015-09-05 DIAGNOSIS — I10 Essential (primary) hypertension: Secondary | ICD-10-CM | POA: Diagnosis not present

## 2015-09-05 DIAGNOSIS — E119 Type 2 diabetes mellitus without complications: Secondary | ICD-10-CM | POA: Diagnosis not present

## 2015-09-05 DIAGNOSIS — Z01818 Encounter for other preprocedural examination: Secondary | ICD-10-CM | POA: Diagnosis not present

## 2015-09-05 DIAGNOSIS — E782 Mixed hyperlipidemia: Secondary | ICD-10-CM | POA: Diagnosis not present

## 2015-09-05 DIAGNOSIS — I447 Left bundle-branch block, unspecified: Secondary | ICD-10-CM | POA: Diagnosis not present

## 2015-09-20 ENCOUNTER — Encounter: Payer: Self-pay | Admitting: Family Medicine

## 2015-09-20 ENCOUNTER — Ambulatory Visit (INDEPENDENT_AMBULATORY_CARE_PROVIDER_SITE_OTHER): Payer: Medicare Other | Admitting: Family Medicine

## 2015-09-20 VITALS — BP 120/70 | HR 80 | Ht 66.0 in | Wt 179.0 lb

## 2015-09-20 DIAGNOSIS — M7522 Bicipital tendinitis, left shoulder: Secondary | ICD-10-CM

## 2015-09-20 MED ORDER — IBUPROFEN 800 MG PO TABS: 800.0000 mg | ORAL_TABLET | Freq: Three times a day (TID) | ORAL | Status: DC | PRN

## 2015-09-20 NOTE — Patient Instructions (Signed)
Tendinitis °Tendinitis is swelling and inflammation of the tendons. Tendons are band-like tissues that connect muscle to bone. Tendinitis commonly occurs in the:  °· Shoulders (rotator cuff). °· Heels (Achilles tendon). °· Elbows (triceps tendon). °CAUSES °Tendinitis is usually caused by overusing the tendon, muscles, and joints involved. When the tissue surrounding a tendon (synovium) becomes inflamed, it is called tenosynovitis. Tendinitis commonly develops in people whose jobs require repetitive motions. °SYMPTOMS °· Pain. °· Tenderness. °· Mild swelling. °DIAGNOSIS °Tendinitis is usually diagnosed by physical exam. Your health care provider may also order X-rays or other imaging tests. °TREATMENT °Your health care provider may recommend certain medicines or exercises for your treatment. °HOME CARE INSTRUCTIONS  °· Use a sling or splint for as long as directed by your health care provider until the pain decreases. °· Put ice on the injured area. °¨ Put ice in a plastic bag. °¨ Place a towel between your skin and the bag. °¨ Leave the ice on for 15-20 minutes, 3-4 times a day, or as directed by your health care provider. °· Avoid using the limb while the tendon is painful. Perform gentle range of motion exercises only as directed by your health care provider. Stop exercises if pain or discomfort increase, unless directed otherwise by your health care provider. °· Only take over-the-counter or prescription medicines for pain, discomfort, or fever as directed by your health care provider. °SEEK MEDICAL CARE IF:  °· Your pain and swelling increase. °· You develop new, unexplained symptoms, especially increased numbness in the hands. °MAKE SURE YOU:  °· Understand these instructions. °· Will watch your condition. °· Will get help right away if you are not doing well or get worse. °  °This information is not intended to replace advice given to you by your health care provider. Make sure you discuss any questions you  have with your health care provider. °  °Document Released: 05/11/2000 Document Revised: 06/04/2014 Document Reviewed: 07/31/2010 °Elsevier Interactive Patient Education ©2016 [employer] ° °

## 2015-09-20 NOTE — Progress Notes (Signed)
Name: Traci Bowen   MRN: [id number]    DOB: [birth year]   Date:09/20/2015       Progress Note  Subjective  Chief Complaint  Chief Complaint  Patient presents with  . Arm Pain    Hx of bursitis- hurting in L) upper arm- hurts to lift arm    Arm Pain  The incident occurred 3 to 5 days ago. The incident occurred at home. The injury mechanism was repetitive motion (making bows). The pain is present in the upper left arm. The quality of the pain is described as aching. The pain does not radiate. The pain is at a severity of 6/10. The pain is moderate. The pain has been intermittent since the incident. Pertinent negatives include no chest pain, muscle weakness, numbness or tingling. The symptoms are aggravated by movement. She has tried acetaminophen for the symptoms. The treatment provided no relief.    No problem-specific assessment & plan notes found for this encounter.   Past Medical History  Diagnosis Date  . Spinal stenosis   . Hyperlipidemia   . Neuropathy ([place])   . Hypertension   . Renal disorder     Past Surgical History  Procedure Laterality Date  . Vaginal hysterectomy    . Cataract extraction Bilateral   . Lithotripsy    . Eye surgery Left march 2016    cysts removed     Family History  Problem Relation Age of Onset  . Diabetes Mother   . Hypertension Mother   . Cancer Father   . Cancer Brother     Social History   Social History  . Marital Status: Widowed    Spouse Name: N/A  . Number of Children: N/A  . Years of Education: N/A   Occupational History  . Not on file.   Social History Main Topics  . Smoking status: Former Research scientist (life sciences)  . Smokeless tobacco: Not on file  . Alcohol Use: No  . Drug Use: No  . Sexual Activity: No   Other Topics Concern  . Not on file   Social History Narrative    Allergies  Allergen Reactions  . Iodinated Diagnostic Agents   . Penicillins      Review of Systems  Constitutional: Negative for fever, chills, weight  loss and malaise/fatigue.  HENT: Negative for ear discharge, ear pain and sore throat.   Eyes: Negative for blurred vision.  Respiratory: Negative for cough, sputum production, shortness of breath and wheezing.   Cardiovascular: Negative for chest pain, palpitations and leg swelling.  Gastrointestinal: Negative for heartburn, nausea, abdominal pain, diarrhea, constipation, blood in stool and melena.  Genitourinary: Negative for dysuria, urgency, frequency and hematuria.  Musculoskeletal: Negative for myalgias, back pain, joint pain and neck pain.  Skin: Negative for rash.  Neurological: Negative for dizziness, tingling, sensory change, focal weakness, numbness and headaches.  Endo/Heme/Allergies: Negative for environmental allergies and polydipsia. Does not bruise/bleed easily.  Psychiatric/Behavioral: Negative for depression and suicidal ideas. The patient is not nervous/anxious and does not have insomnia.      Objective  Filed Vitals:   09/20/15 0818  BP: 120/70  Pulse: 80  Height: 5\' 6"  (1.676 m)  Weight: 179 lb (81.194 kg)    Physical Exam  Constitutional: She is well-developed, well-nourished, and in no distress. No distress.  HENT:  Head: Normocephalic and atraumatic.  Right Ear: External ear normal.  Left Ear: External ear normal.  Nose: Nose normal.  Mouth/Throat: Oropharynx is clear and moist.  Eyes: Conjunctivae and  EOM are normal. Pupils are equal, round, and reactive to light. Right eye exhibits no discharge. Left eye exhibits no discharge.  Neck: Normal range of motion. Neck supple. No JVD present. No thyromegaly present.  Cardiovascular: Normal rate, regular rhythm, normal heart sounds and intact distal pulses.  Exam reveals no gallop and no friction rub.   No murmur heard. Pulmonary/Chest: Effort normal and breath sounds normal.  Abdominal: Soft. Bowel sounds are normal. She exhibits no mass. There is no tenderness. There is no guarding.  Musculoskeletal: Normal  range of motion. She exhibits tenderness. She exhibits no edema.       Left upper arm: She exhibits tenderness.       Arms: Tender left bicep  Lymphadenopathy:    She has no cervical adenopathy.  Neurological: She is alert. She has normal reflexes.  Skin: Skin is warm and dry. She is not diaphoretic.  Psychiatric: Mood and affect normal.  Nursing note and vitals reviewed.     Assessment & Plan  Problem List Items Addressed This Visit    None    Visit Diagnoses    Biceps tendonitis on left    -  Primary    Relevant Medications    ibuprofen (ADVIL,MOTRIN) 800 MG tablet         Dr. Trevaun Rendleman [facility]  09/20/2015

## 2015-09-23 DIAGNOSIS — I447 Left bundle-branch block, unspecified: Secondary | ICD-10-CM | POA: Diagnosis not present

## 2015-09-23 DIAGNOSIS — R9431 Abnormal electrocardiogram [ECG] [EKG]: Secondary | ICD-10-CM | POA: Diagnosis not present

## 2015-09-23 DIAGNOSIS — Z01818 Encounter for other preprocedural examination: Secondary | ICD-10-CM | POA: Diagnosis not present

## 2015-10-03 DIAGNOSIS — E782 Mixed hyperlipidemia: Secondary | ICD-10-CM | POA: Diagnosis not present

## 2015-10-03 DIAGNOSIS — R918 Other nonspecific abnormal finding of lung field: Secondary | ICD-10-CM | POA: Diagnosis not present

## 2015-10-03 DIAGNOSIS — I10 Essential (primary) hypertension: Secondary | ICD-10-CM | POA: Diagnosis not present

## 2015-10-03 DIAGNOSIS — I447 Left bundle-branch block, unspecified: Secondary | ICD-10-CM | POA: Diagnosis not present

## 2015-10-03 DIAGNOSIS — R0602 Shortness of breath: Secondary | ICD-10-CM | POA: Diagnosis not present

## 2015-10-03 DIAGNOSIS — R9431 Abnormal electrocardiogram [ECG] [EKG]: Secondary | ICD-10-CM | POA: Diagnosis not present

## 2015-10-03 DIAGNOSIS — E119 Type 2 diabetes mellitus without complications: Secondary | ICD-10-CM | POA: Diagnosis not present

## 2015-10-03 DIAGNOSIS — Z01818 Encounter for other preprocedural examination: Secondary | ICD-10-CM | POA: Diagnosis not present

## 2015-10-20 ENCOUNTER — Ambulatory Visit (INDEPENDENT_AMBULATORY_CARE_PROVIDER_SITE_OTHER): Payer: Medicare Other | Admitting: Family Medicine

## 2015-10-20 ENCOUNTER — Encounter: Payer: Self-pay | Admitting: Family Medicine

## 2015-10-20 VITALS — BP 130/78 | HR 60 | Ht 66.0 in | Wt 178.0 lb

## 2015-10-20 DIAGNOSIS — J0100 Acute maxillary sinusitis, unspecified: Secondary | ICD-10-CM | POA: Diagnosis not present

## 2015-10-20 DIAGNOSIS — K219 Gastro-esophageal reflux disease without esophagitis: Secondary | ICD-10-CM | POA: Diagnosis not present

## 2015-10-20 DIAGNOSIS — J40 Bronchitis, not specified as acute or chronic: Secondary | ICD-10-CM | POA: Diagnosis not present

## 2015-10-20 MED ORDER — AZITHROMYCIN 250 MG PO TABS: ORAL_TABLET | ORAL | Status: DC

## 2015-10-20 MED ORDER — GUAIFENESIN-CODEINE 100-10 MG/5ML PO SYRP: 5.0000 mL | ORAL_SOLUTION | Freq: Three times a day (TID) | ORAL | Status: DC | PRN

## 2015-10-20 MED ORDER — PANTOPRAZOLE SODIUM 40 MG PO TBEC: 40.0000 mg | DELAYED_RELEASE_TABLET | Freq: Every day | ORAL | Status: DC

## 2015-10-20 NOTE — Progress Notes (Signed)
Name: Traci Bowen   MRN: [id number]    DOB: 10/05/1940   Date:10/20/2015       Progress Note  Subjective  Chief Complaint  Chief Complaint  Patient presents with  . Gastroesophageal Reflux    "anything I eat, except for ice cream, burns when I eat or drink"  . Sinusitis    cough and green production    Gastroesophageal Reflux She complains of coughing and heartburn. She reports no abdominal pain, no belching, no chest pain, no choking, no dysphagia, no early satiety, no hoarse voice, no nausea, no sore throat, no stridor, no tooth decay or no wheezing. This is a recurrent problem. The current episode started more than 1 year ago. The problem occurs frequently. The problem has been gradually worsening. The heartburn duration is several minutes. The heartburn is located in the substernum. The heartburn is of moderate intensity. The symptoms are aggravated by certain foods. Pertinent negatives include no anemia, fatigue, melena, muscle weakness, orthopnea or weight loss. She has tried nothing for the symptoms. The treatment provided no relief.  Sinusitis This is a new problem. The current episode started in the past 7 days. The problem has been gradually worsening since onset. There has been no fever. The pain is mild. Associated symptoms include congestion, coughing, ear pain, sinus pressure and sneezing. Pertinent negatives include no chills, headaches, hoarse voice, neck pain, shortness of breath or sore throat. Past treatments include nothing. The treatment provided no relief.    No problem-specific assessment & plan notes found for this encounter.   Past Medical History  Diagnosis Date  . Spinal stenosis   . Hyperlipidemia   . Neuropathy ([place])   . Hypertension   . Renal disorder     Past Surgical History  Procedure Laterality Date  . Vaginal hysterectomy    . Cataract extraction Bilateral   . Lithotripsy    . Eye surgery Left march 2016    cysts removed     Family History   Problem Relation Age of Onset  . Diabetes Mother   . Hypertension Mother   . Cancer Father   . Cancer Brother     Social History   Social History  . Marital Status: Widowed    Spouse Name: N/A  . Number of Children: N/A  . Years of Education: N/A   Occupational History  . Not on file.   Social History Main Topics  . Smoking status: Former Research scientist (life sciences)  . Smokeless tobacco: Not on file  . Alcohol Use: No  . Drug Use: No  . Sexual Activity: No   Other Topics Concern  . Not on file   Social History Narrative    Allergies  Allergen Reactions  . Iodinated Diagnostic Agents   . Penicillins      Review of Systems  Constitutional: Negative for fever, chills, weight loss, malaise/fatigue and fatigue.  HENT: Positive for congestion, ear pain, sinus pressure and sneezing. Negative for ear discharge, hoarse voice and sore throat.   Eyes: Negative for blurred vision.  Respiratory: Positive for cough. Negative for sputum production, choking, shortness of breath and wheezing.   Cardiovascular: Negative for chest pain, palpitations and leg swelling.  Gastrointestinal: Positive for heartburn. Negative for dysphagia, nausea, abdominal pain, diarrhea, constipation, blood in stool and melena.  Genitourinary: Negative for dysuria, urgency, frequency and hematuria.  Musculoskeletal: Negative for myalgias, back pain, joint pain, muscle weakness and neck pain.  Skin: Negative for rash.  Neurological: Negative for dizziness, tingling, sensory  change, focal weakness and headaches.  Endo/Heme/Allergies: Negative for environmental allergies and polydipsia. Does not bruise/bleed easily.  Psychiatric/Behavioral: Negative for depression and suicidal ideas. The patient is not nervous/anxious and does not have insomnia.      Objective  Filed Vitals:   10/20/15 0808  BP: 130/78  Pulse: 60  Height: 5\' 6"  (1.676 m)  Weight: 178 lb (80.74 kg)    Physical Exam  Constitutional: She is  well-developed, well-nourished, and in no distress. No distress.  HENT:  Head: Normocephalic and atraumatic.  Right Ear: Tympanic membrane, external ear and ear canal normal.  Left Ear: Tympanic membrane, external ear and ear canal normal.  Nose: Right sinus exhibits maxillary sinus tenderness. Left sinus exhibits no maxillary sinus tenderness.  Mouth/Throat: Oropharynx is clear and moist.  Eyes: Conjunctivae and EOM are normal. Pupils are equal, round, and reactive to light. Right eye exhibits no discharge. Left eye exhibits no discharge.  Neck: Normal range of motion. Neck supple. No JVD present. No thyromegaly present.  Cardiovascular: Normal rate, regular rhythm, normal heart sounds and intact distal pulses.  Exam reveals no gallop and no friction rub.   No murmur heard. Pulmonary/Chest: Effort normal and breath sounds normal.  Abdominal: Soft. Bowel sounds are normal. She exhibits no mass. There is no tenderness. There is no guarding.  Musculoskeletal: Normal range of motion. She exhibits no edema.  Lymphadenopathy:    She has no cervical adenopathy.  Neurological: She is alert. She has normal reflexes.  Skin: Skin is warm and dry. She is not diaphoretic.  Psychiatric: Mood and affect normal.      Assessment & Plan  Problem List Items Addressed This Visit    None    Visit Diagnoses    Gastroesophageal reflux disease, esophagitis presence not specified    -  Primary    Relevant Medications    pantoprazole (PROTONIX) 40 MG tablet    Acute maxillary sinusitis, recurrence not specified        Relevant Medications    azithromycin (ZITHROMAX) 250 MG tablet    guaiFENesin-codeine (ROBITUSSIN AC) 100-10 MG/5ML syrup    Bronchitis        Relevant Medications    azithromycin (ZITHROMAX) 250 MG tablet    guaiFENesin-codeine (ROBITUSSIN AC) 100-10 MG/5ML syrup         Dr. [facility]  10/20/2015

## 2015-12-02 ENCOUNTER — Other Ambulatory Visit: Payer: Self-pay

## 2016-01-06 ENCOUNTER — Encounter: Payer: Self-pay | Admitting: Family Medicine

## 2016-01-06 ENCOUNTER — Ambulatory Visit (INDEPENDENT_AMBULATORY_CARE_PROVIDER_SITE_OTHER): Payer: Medicare Other | Admitting: Family Medicine

## 2016-01-06 VITALS — BP 120/70 | HR 64 | Ht 66.0 in | Wt 169.0 lb

## 2016-01-06 DIAGNOSIS — G629 Polyneuropathy, unspecified: Secondary | ICD-10-CM

## 2016-01-06 DIAGNOSIS — M48061 Spinal stenosis, lumbar region without neurogenic claudication: Secondary | ICD-10-CM

## 2016-01-06 DIAGNOSIS — E785 Hyperlipidemia, unspecified: Secondary | ICD-10-CM

## 2016-01-06 DIAGNOSIS — I10 Essential (primary) hypertension: Secondary | ICD-10-CM | POA: Diagnosis not present

## 2016-01-06 MED ORDER — AMLODIPINE BESYLATE 10 MG PO TABS: 10.0000 mg | ORAL_TABLET | Freq: Every day | ORAL | 1 refills | Status: DC

## 2016-01-06 MED ORDER — GABAPENTIN 300 MG PO CAPS: 300.0000 mg | ORAL_CAPSULE | Freq: Every day | ORAL | 1 refills | Status: DC

## 2016-01-06 MED ORDER — SIMVASTATIN 40 MG PO TABS: 40.0000 mg | ORAL_TABLET | Freq: Every day | ORAL | 1 refills | Status: DC

## 2016-01-06 MED ORDER — VALSARTAN 160 MG PO TABS: 160.0000 mg | ORAL_TABLET | Freq: Every day | ORAL | 1 refills | Status: DC

## 2016-01-06 NOTE — Progress Notes (Signed)
Name: Traci Bowen   MRN: [id number]    DOB: Sep 28, 1940   Date:01/06/2016       Progress Note  Subjective  Chief Complaint  Chief Complaint  Patient presents with  . Hypertension  . Hyperlipidemia  . Peripheral Neuropathy    Hypertension  This is a chronic problem. The current episode started more than 1 year ago. The problem has been gradually improving since onset. The problem is controlled. Pertinent negatives include no anxiety, blurred vision, chest pain, headaches, malaise/fatigue, neck pain, orthopnea, palpitations, peripheral edema, PND, shortness of breath or sweats. There are no associated agents to hypertension. Risk factors for coronary artery disease include dyslipidemia, obesity and post-menopausal state. Past treatments include angiotensin blockers and calcium channel blockers. The current treatment provides mild improvement. There are no compliance problems.  There is no history of angina, kidney disease, CAD/MI, CVA, heart failure, left ventricular hypertrophy, PVD, renovascular disease or retinopathy. There is no history of chronic renal disease or a hypertension causing med.  Hyperlipidemia  This is a chronic problem. The current episode started more than 1 year ago. The problem is controlled. Recent lipid tests were reviewed and are normal. She has no history of chronic renal disease, diabetes, hypothyroidism, liver disease, obesity or nephrotic syndrome. There are no known factors aggravating her hyperlipidemia. Pertinent negatives include no chest pain, focal sensory loss, focal weakness, leg pain, myalgias or shortness of breath. Current antihyperlipidemic treatment includes statins. The current treatment provides moderate improvement of lipids. There are no compliance problems.  Risk factors for coronary artery disease include dyslipidemia, hypertension and post-menopausal.    No problem-specific Assessment & Plan notes found for this encounter.   Past Medical History:   Diagnosis Date  . Hyperlipidemia   . Hypertension   . Neuropathy ([place])   . Renal disorder   . Spinal stenosis     Past Surgical History:  Procedure Laterality Date  . CATARACT EXTRACTION Bilateral   . EYE SURGERY Left march 2016   cysts removed   . LITHOTRIPSY    . VAGINAL HYSTERECTOMY      Family History  Problem Relation Age of Onset  . Diabetes Mother   . Hypertension Mother   . Cancer Father   . Cancer Brother     Social History   Social History  . Marital status: Widowed    Spouse name: N/A  . Number of children: N/A  . Years of education: N/A   Occupational History  . Not on file.   Social History Main Topics  . Smoking status: Former Research scientist (life sciences)  . Smokeless tobacco: Not on file  . Alcohol use No  . Drug use: No  . Sexual activity: No   Other Topics Concern  . Not on file   Social History Narrative  . No narrative on file    Allergies  Allergen Reactions  . Iodinated Diagnostic Agents   . Penicillins      Review of Systems  Constitutional: Negative for chills, fever, malaise/fatigue and weight loss.  HENT: Negative for ear discharge, ear pain and sore throat.   Eyes: Negative for blurred vision.  Respiratory: Negative for cough, sputum production, shortness of breath and wheezing.   Cardiovascular: Negative for chest pain, palpitations, orthopnea, leg swelling and PND.  Gastrointestinal: Negative for abdominal pain, blood in stool, constipation, diarrhea, heartburn, melena and nausea.  Genitourinary: Negative for dysuria, frequency, hematuria and urgency.  Musculoskeletal: Negative for back pain, joint pain, myalgias and neck pain.  Skin: Negative  for rash.  Neurological: Negative for dizziness, tingling, sensory change, focal weakness and headaches.  Endo/Heme/Allergies: Negative for environmental allergies and polydipsia. Does not bruise/bleed easily.  Psychiatric/Behavioral: Negative for depression and suicidal ideas. The patient is not  nervous/anxious and does not have insomnia.      Objective  Vitals:   01/06/16 0921  BP: 120/70  Pulse: 64  Weight: 169 lb (76.7 kg)  Height: 5\' 6"  (1.676 m)    Physical Exam  Constitutional: She is well-developed, well-nourished, and in no distress. No distress.  HENT:  Head: Normocephalic and atraumatic.  Right Ear: External ear normal.  Left Ear: External ear normal.  Nose: Nose normal.  Mouth/Throat: Oropharynx is clear and moist.  Eyes: Conjunctivae and EOM are normal. Pupils are equal, round, and reactive to light. Right eye exhibits no discharge. Left eye exhibits no discharge.  Neck: Normal range of motion. Neck supple. No JVD present. No thyromegaly present.  Cardiovascular: Normal rate, regular rhythm, normal heart sounds and intact distal pulses.  Exam reveals no gallop and no friction rub.   No murmur heard. Pulmonary/Chest: Effort normal and breath sounds normal.  Abdominal: Soft. Bowel sounds are normal. She exhibits no mass. There is no tenderness. There is no guarding.  Musculoskeletal: Normal range of motion. She exhibits no edema.  Lymphadenopathy:    She has no cervical adenopathy.  Neurological: She is alert.  Skin: Skin is warm and dry. She is not diaphoretic.  Psychiatric: Mood and affect normal.  Nursing note and vitals reviewed.     Assessment & Plan  Problem List Items Addressed This Visit      Cardiovascular and Mediastinum   Essential hypertension - Primary   Relevant Medications   amLODipine (NORVASC) 10 MG tablet   valsartan (DIOVAN) 160 MG tablet   simvastatin (ZOCOR) 40 MG tablet   Other Relevant Orders   Renal Function Panel     Nervous and Auditory   Neuropathy (HCC)     Other   Hyperlipidemia   Relevant Medications   amLODipine (NORVASC) 10 MG tablet   valsartan (DIOVAN) 160 MG tablet   simvastatin (ZOCOR) 40 MG tablet   Other Relevant Orders   Lipid Profile    Other Visit Diagnoses    Spinal stenosis of lumbar region        Relevant Medications   gabapentin (NEURONTIN) 300 MG capsule        Dr. Seanna Sisler [facility]  01/06/16

## 2016-01-07 LAB — LIPID PANEL
Chol/HDL Ratio: 5.1 ratio units — ABNORMAL HIGH (ref 0.0–4.4)
Cholesterol, Total: 154 mg/dL (ref 100–199)
HDL: 30 mg/dL — ABNORMAL LOW (ref >39)
LDL Calculated: 80 mg/dL (ref 0–99)
Triglycerides: 221 mg/dL — ABNORMAL HIGH (ref 0–149)
VLDL Cholesterol Cal: 44 mg/dL — ABNORMAL HIGH (ref 5–40)

## 2016-01-07 LAB — RENAL FUNCTION PANEL
Albumin: 4.3 g/dL (ref 3.5–4.8)
BUN / CREAT RATIO: 11 — AB (ref 12–28)
BUN: 7 mg/dL — ABNORMAL LOW (ref 8–27)
CHLORIDE: 99 mmol/L (ref 96–106)
CO2: 25 mmol/L (ref 18–29)
Calcium: 10 mg/dL (ref 8.7–10.3)
Creatinine, Ser: 0.64 mg/dL (ref 0.57–1.00)
GFR calc non Af Amer: 88 mL/min/{1.73_m2} (ref >59)
GFR, EST AFRICAN AMERICAN: 102 mL/min/{1.73_m2} (ref >59)
GLUCOSE: 110 mg/dL — AB (ref 65–99)
POTASSIUM: 3.7 mmol/L (ref 3.5–5.2)
Phosphorus: 3.3 mg/dL (ref 2.5–4.5)
Sodium: 144 mmol/L (ref 134–144)

## 2016-01-10 DIAGNOSIS — D3131 Benign neoplasm of right choroid: Secondary | ICD-10-CM | POA: Diagnosis not present

## 2016-01-10 DIAGNOSIS — H26493 Other secondary cataract, bilateral: Secondary | ICD-10-CM | POA: Diagnosis not present

## 2016-01-10 DIAGNOSIS — H40003 Preglaucoma, unspecified, bilateral: Secondary | ICD-10-CM | POA: Diagnosis not present

## 2016-02-02 DIAGNOSIS — H26493 Other secondary cataract, bilateral: Secondary | ICD-10-CM | POA: Diagnosis not present

## 2016-02-21 DIAGNOSIS — H26491 Other secondary cataract, right eye: Secondary | ICD-10-CM | POA: Diagnosis not present

## 2016-02-27 DIAGNOSIS — H26492 Other secondary cataract, left eye: Secondary | ICD-10-CM | POA: Diagnosis not present

## 2016-03-14 DIAGNOSIS — D3131 Benign neoplasm of right choroid: Secondary | ICD-10-CM | POA: Diagnosis not present

## 2016-03-14 DIAGNOSIS — H04123 Dry eye syndrome of bilateral lacrimal glands: Secondary | ICD-10-CM | POA: Diagnosis not present

## 2016-03-14 DIAGNOSIS — H40013 Open angle with borderline findings, low risk, bilateral: Secondary | ICD-10-CM | POA: Diagnosis not present

## 2016-03-14 DIAGNOSIS — H1045 Other chronic allergic conjunctivitis: Secondary | ICD-10-CM | POA: Diagnosis not present

## 2016-03-16 DIAGNOSIS — Z23 Encounter for immunization: Secondary | ICD-10-CM | POA: Diagnosis not present

## 2016-03-21 ENCOUNTER — Other Ambulatory Visit: Payer: Self-pay

## 2016-04-30 ENCOUNTER — Encounter: Payer: Self-pay | Admitting: Family Medicine

## 2016-04-30 ENCOUNTER — Ambulatory Visit (INDEPENDENT_AMBULATORY_CARE_PROVIDER_SITE_OTHER): Payer: Medicare Other | Admitting: Family Medicine

## 2016-04-30 VITALS — BP 126/78 | HR 74 | Temp 98.0°F | Ht 66.0 in | Wt 161.0 lb

## 2016-04-30 DIAGNOSIS — M48061 Spinal stenosis, lumbar region without neurogenic claudication: Secondary | ICD-10-CM | POA: Diagnosis not present

## 2016-04-30 DIAGNOSIS — Z23 Encounter for immunization: Secondary | ICD-10-CM

## 2016-04-30 DIAGNOSIS — G629 Polyneuropathy, unspecified: Secondary | ICD-10-CM

## 2016-04-30 DIAGNOSIS — J40 Bronchitis, not specified as acute or chronic: Secondary | ICD-10-CM

## 2016-04-30 DIAGNOSIS — E782 Mixed hyperlipidemia: Secondary | ICD-10-CM | POA: Diagnosis not present

## 2016-04-30 DIAGNOSIS — I10 Essential (primary) hypertension: Secondary | ICD-10-CM

## 2016-04-30 MED ORDER — VALSARTAN 160 MG PO TABS: 160.0000 mg | ORAL_TABLET | Freq: Every day | ORAL | 1 refills | Status: DC

## 2016-04-30 MED ORDER — GABAPENTIN 300 MG PO CAPS: 300.0000 mg | ORAL_CAPSULE | Freq: Every day | ORAL | 1 refills | Status: DC

## 2016-04-30 MED ORDER — AZITHROMYCIN 250 MG PO TABS: ORAL_TABLET | ORAL | 0 refills | Status: DC

## 2016-04-30 MED ORDER — AMLODIPINE BESYLATE 10 MG PO TABS: 10.0000 mg | ORAL_TABLET | Freq: Every day | ORAL | 1 refills | Status: DC

## 2016-04-30 MED ORDER — SIMVASTATIN 40 MG PO TABS: 40.0000 mg | ORAL_TABLET | Freq: Every day | ORAL | 1 refills | Status: DC

## 2016-04-30 NOTE — Progress Notes (Signed)
Name: Traci Bowen   MRN: [id number]    DOB: 29-Dec-1949   Date:04/30/2025       Progress Note  Subjective  Chief Complaint  Chief Complaint  Patient presents with  . Cough    Pt stated having cough for about 3 weeks and refill for all meds.    Cough  This is a new problem. The current episode started 1 to 4 weeks ago. The problem has been gradually worsening. The problem occurs every few minutes. The cough is productive of purulent sputum. Associated symptoms include nasal congestion, postnasal drip, rhinorrhea and sweats. Pertinent negatives include no chest pain, chills, ear congestion, ear pain, fever, headaches, heartburn, hemoptysis, myalgias, rash, sore throat, shortness of breath, weight loss or wheezing. The symptoms are aggravated by cold air. She has tried nothing for the symptoms. The treatment provided mild relief. There is no history of asthma, bronchiectasis, bronchitis, COPD, emphysema, environmental allergies or pneumonia.  Hypertension  This is a chronic problem. The current episode started more than 1 year ago. The problem is unchanged. The problem is controlled. Associated symptoms include sweats. Pertinent negatives include no anxiety, blurred vision, chest pain, headaches, malaise/fatigue, neck pain, orthopnea, palpitations, peripheral edema, PND or shortness of breath. There are no associated agents to hypertension. Risk factors for coronary artery disease include dyslipidemia. Past treatments include angiotensin blockers, diuretics and calcium channel blockers. There are no compliance problems.  There is no history of angina, kidney disease, CAD/MI, CVA, heart failure, left ventricular hypertrophy, PVD, renovascular disease, retinopathy or a thyroid problem. There is no history of chronic renal disease, coarctation of the aorta, hyperaldosteronism, hypercortisolism, hyperparathyroidism, a hypertension causing med, pheochromocytoma or sleep apnea.  Hyperlipidemia  This is a  chronic problem. The current episode started more than 1 month ago. The problem is controlled. Recent lipid tests were reviewed and are normal. She has no history of chronic renal disease, diabetes, hypothyroidism, liver disease, obesity or nephrotic syndrome. There are no known factors aggravating her hyperlipidemia. Pertinent negatives include no chest pain, focal sensory loss, focal weakness, leg pain, myalgias or shortness of breath. Current antihyperlipidemic treatment includes statins. The current treatment provides mild improvement of lipids. There are no compliance problems.  There are no known risk factors for coronary artery disease.  Neck Pain   This is a new problem. The current episode started more than 1 year ago. The problem occurs intermittently. The problem has been waxing and waning. The pain is associated with nothing. The pain is present in the midline. The quality of the pain is described as aching. The pain is at a severity of 5/10. The pain is moderate. Pertinent negatives include no chest pain, fever, headaches, leg pain, numbness, paresis, photophobia, tingling, weakness or weight loss. Treatments tried: gabapentin. The treatment provided mild relief.    No problem-specific Assessment & Plan notes found for this encounter.   Past Medical History:  Diagnosis Date  . Hyperlipidemia   . Hypertension   . Neuropathy ([place])   . Renal disorder   . Spinal stenosis     Past Surgical History:  Procedure Laterality Date  . CATARACT EXTRACTION Bilateral   . EYE SURGERY Left march 2016   cysts removed   . LITHOTRIPSY    . VAGINAL HYSTERECTOMY      Family History  Problem Relation Age of Onset  . Diabetes Mother   . Hypertension Mother   . Cancer Father   . Cancer Brother     Social History  Social History  . Marital status: Widowed    Spouse name: N/A  . Number of children: N/A  . Years of education: N/A   Occupational History  . Not on file.   Social History  Main Topics  . Smoking status: Former Research scientist (life sciences)  . Smokeless tobacco: Never Used  . Alcohol use No  . Drug use: No  . Sexual activity: No   Other Topics Concern  . Not on file   Social History Narrative  . No narrative on file    Allergies  Allergen Reactions  . Iodinated Diagnostic Agents   . Penicillins      Review of Systems  Constitutional: Negative for chills, fever, malaise/fatigue and weight loss.  HENT: Positive for postnasal drip and rhinorrhea. Negative for ear discharge, ear pain and sore throat.   Eyes: Negative for blurred vision and photophobia.  Respiratory: Positive for cough. Negative for hemoptysis, sputum production, shortness of breath and wheezing.   Cardiovascular: Negative for chest pain, palpitations, orthopnea, leg swelling and PND.  Gastrointestinal: Negative for abdominal pain, blood in stool, constipation, diarrhea, heartburn, melena and nausea.  Genitourinary: Negative for dysuria, frequency, hematuria and urgency.  Musculoskeletal: Negative for back pain, joint pain, myalgias and neck pain.  Skin: Negative for rash.  Neurological: Negative for dizziness, tingling, sensory change, focal weakness, weakness, numbness and headaches.  Endo/Heme/Allergies: Negative for environmental allergies and polydipsia. Does not bruise/bleed easily.  Psychiatric/Behavioral: Negative for depression and suicidal ideas. The patient is not nervous/anxious and does not have insomnia.      Objective  Vitals:   04/30/16 1343  BP: 126/78  Pulse: 74  Temp: 98 F (36.7 C)  Weight: 161 lb (73 kg)  Height: 5\' 6"  (1.676 m)    Physical Exam  Constitutional: She is well-developed, well-nourished, and in no distress. No distress.  HENT:  Head: Normocephalic and atraumatic.  Right Ear: Tympanic membrane, external ear and ear canal normal.  Left Ear: Tympanic membrane, external ear and ear canal normal.  Nose: Nose normal.  Mouth/Throat: Oropharynx is clear and moist.   Eyes: Conjunctivae and EOM are normal. Pupils are equal, round, and reactive to light. Right eye exhibits no discharge. Left eye exhibits no discharge.  Neck: Normal range of motion. Neck supple. No JVD present. No thyromegaly present.  Cardiovascular: Normal rate, regular rhythm, normal heart sounds and intact distal pulses.  Exam reveals no gallop and no friction rub.   No murmur heard. Pulmonary/Chest: Effort normal and breath sounds normal. No respiratory distress. She has no wheezes. She has no rales.  Abdominal: Soft. Bowel sounds are normal. She exhibits no mass. There is no tenderness. There is no guarding.  Musculoskeletal: Normal range of motion. She exhibits no edema.  Lymphadenopathy:    She has no cervical adenopathy.  Neurological: She is alert. She has normal reflexes.  Skin: Skin is warm and dry. She is not diaphoretic.  Psychiatric: Mood and affect normal.  Nursing note and vitals reviewed.     Assessment & Plan  Problem List Items Addressed This Visit      Cardiovascular and Mediastinum   Essential hypertension - Primary   Relevant Medications   amLODipine (NORVASC) 10 MG tablet   valsartan (DIOVAN) 160 MG tablet   simvastatin (ZOCOR) 40 MG tablet     Nervous and Auditory   Neuropathy (HCC)     Other   Hyperlipidemia   Relevant Medications   amLODipine (NORVASC) 10 MG tablet   valsartan (DIOVAN) 160 MG  tablet   simvastatin (ZOCOR) 40 MG tablet    Other Visit Diagnoses    Bronchitis       Relevant Medications   azithromycin (ZITHROMAX) 250 MG tablet   Spinal stenosis of lumbar region, unspecified whether neurogenic claudication present       Relevant Medications   gabapentin (NEURONTIN) 300 MG capsule   Immunization due       Relevant Orders   Tdap vaccine greater than or equal to 7yo IM (Completed)   Pneumococcal conjugate vaccine 13-valent IM (Completed)        Dr. [facility]  04/30/16

## 2016-05-18 ENCOUNTER — Ambulatory Visit (INDEPENDENT_AMBULATORY_CARE_PROVIDER_SITE_OTHER): Payer: Medicare Other | Admitting: Family Medicine

## 2016-05-18 ENCOUNTER — Encounter: Payer: Self-pay | Admitting: Family Medicine

## 2016-05-18 VITALS — BP 120/80 | HR 70 | Ht 66.0 in | Wt 161.0 lb

## 2016-05-18 DIAGNOSIS — J189 Pneumonia, unspecified organism: Secondary | ICD-10-CM

## 2016-05-18 DIAGNOSIS — M7542 Impingement syndrome of left shoulder: Secondary | ICD-10-CM

## 2016-05-18 DIAGNOSIS — J181 Lobar pneumonia, unspecified organism: Secondary | ICD-10-CM

## 2016-05-18 MED ORDER — ETODOLAC 400 MG PO TABS: 400.0000 mg | ORAL_TABLET | Freq: Two times a day (BID) | ORAL | 1 refills | Status: DC

## 2016-05-18 MED ORDER — GUAIFENESIN-CODEINE 100-10 MG/5ML PO SYRP: 5.0000 mL | ORAL_SOLUTION | Freq: Three times a day (TID) | ORAL | 0 refills | Status: DC | PRN

## 2016-05-18 NOTE — Progress Notes (Signed)
Name: Traci Bowen   MRN: [id number]    DOB: 1940-09-17   Date:05/18/2016       Progress Note  Subjective  Chief Complaint  Chief Complaint  Patient presents with  . Shoulder Pain    upper L) arm into shoulder pain- is currently taking Clindamycin for teeth from dentist    Shoulder Pain   The pain is present in the left shoulder. This is a new problem. The current episode started in the past 7 days. There has been no history of extremity trauma. The problem occurs constantly. The problem has been gradually worsening. The quality of the pain is described as aching. The pain is at a severity of 7/10. The pain is moderate. Associated symptoms include a limited range of motion and stiffness. Pertinent negatives include no fever, inability to bear weight, itching, joint locking, joint swelling, numbness or tingling. The symptoms are aggravated by activity. She has tried NSAIDS for the symptoms. The treatment provided moderate relief. Family history does not include gout or rheumatoid arthritis. There is no history of diabetes, gout, osteoarthritis or rheumatoid arthritis.    No problem-specific Assessment & Plan notes found for this encounter.   Past Medical History:  Diagnosis Date  . Hyperlipidemia   . Hypertension   . Neuropathy ([place])   . Renal disorder   . Spinal stenosis     Past Surgical History:  Procedure Laterality Date  . CATARACT EXTRACTION Bilateral   . EYE SURGERY Left march 2016   cysts removed   . LITHOTRIPSY    . VAGINAL HYSTERECTOMY      Family History  Problem Relation Age of Onset  . Diabetes Mother   . Hypertension Mother   . Cancer Father   . Cancer Brother     Social History   Social History  . Marital status: Widowed    Spouse name: N/A  . Number of children: N/A  . Years of education: N/A   Occupational History  . Not on file.   Social History Main Topics  . Smoking status: Former Research scientist (life sciences)  . Smokeless tobacco: Never Used  . Alcohol use No   . Drug use: No  . Sexual activity: No   Other Topics Concern  . Not on file   Social History Narrative  . No narrative on file    Allergies  Allergen Reactions  . Iodinated Diagnostic Agents   . Penicillins      Review of Systems  Constitutional: Negative for chills, fever, malaise/fatigue and weight loss.  HENT: Negative for ear discharge, ear pain and sore throat.   Eyes: Negative for blurred vision.  Respiratory: Negative for cough, sputum production, shortness of breath and wheezing.   Cardiovascular: Negative for chest pain, palpitations and leg swelling.  Gastrointestinal: Negative for abdominal pain, blood in stool, constipation, diarrhea, heartburn, melena and nausea.  Genitourinary: Negative for dysuria, frequency, hematuria and urgency.  Musculoskeletal: Positive for stiffness. Negative for back pain, gout, joint pain, myalgias and neck pain.  Skin: Negative for itching and rash.  Neurological: Negative for dizziness, tingling, sensory change, focal weakness, numbness and headaches.  Endo/Heme/Allergies: Negative for environmental allergies and polydipsia. Does not bruise/bleed easily.  Psychiatric/Behavioral: Negative for depression and suicidal ideas. The patient is not nervous/anxious and does not have insomnia.      Objective  Vitals:   05/18/16 0849  BP: 120/80  Pulse: 70  SpO2: 97%  Weight: 161 lb (73 kg)  Height: 5\' 6"  (1.676 m)  Physical Exam  Constitutional: She is well-developed, well-nourished, and in no distress. No distress.  HENT:  Head: Normocephalic and atraumatic.  Right Ear: External ear normal.  Left Ear: External ear normal.  Nose: Nose normal.  Mouth/Throat: Oropharynx is clear and moist.  Eyes: Conjunctivae and EOM are normal. Pupils are equal, round, and reactive to light. Right eye exhibits no discharge. Left eye exhibits no discharge.  Neck: Normal range of motion. Neck supple. No JVD present. No thyromegaly present.   Cardiovascular: Normal rate, regular rhythm, normal heart sounds and intact distal pulses.  Exam reveals no gallop and no friction rub.   No murmur heard. Pulmonary/Chest: Effort normal and breath sounds normal. No respiratory distress. She has no decreased breath sounds. She has no wheezes. She has no rhonchi. She has no rales. She exhibits no tenderness.  Pleural friction rub  Abdominal: Soft. Bowel sounds are normal. She exhibits no mass. There is no tenderness. There is no guarding.  Musculoskeletal: She exhibits no edema.       Right shoulder: She exhibits tenderness.  Lymphadenopathy:    She has no cervical adenopathy.  Neurological: She is alert. She has normal reflexes.  Skin: Skin is warm and dry. She is not diaphoretic.  Psychiatric: Mood and affect normal.  Nursing note and vitals reviewed.     Assessment & Plan  Problem List Items Addressed This Visit    None    Visit Diagnoses    Impingement syndrome of left shoulder    -  Primary   Relevant Medications   etodolac (LODINE) 400 MG tablet   Community acquired pneumonia of right lower lobe of lung ([place])       cont clindamycin   Relevant Medications   guaiFENesin-codeine (ROBITUSSIN AC) 100-10 MG/5ML syrup        Dr. [facility]  05/18/16

## 2016-08-17 ENCOUNTER — Encounter: Payer: Self-pay | Admitting: Family Medicine

## 2016-08-17 ENCOUNTER — Ambulatory Visit (INDEPENDENT_AMBULATORY_CARE_PROVIDER_SITE_OTHER): Payer: Medicare Other | Admitting: Family Medicine

## 2016-08-17 VITALS — BP 138/70 | HR 80 | Ht 66.0 in | Wt 167.0 lb

## 2016-08-17 DIAGNOSIS — J0100 Acute maxillary sinusitis, unspecified: Secondary | ICD-10-CM | POA: Diagnosis not present

## 2016-08-17 DIAGNOSIS — J40 Bronchitis, not specified as acute or chronic: Secondary | ICD-10-CM | POA: Diagnosis not present

## 2016-08-17 MED ORDER — GUAIFENESIN-CODEINE 100-10 MG/5ML PO SYRP: 5.0000 mL | ORAL_SOLUTION | Freq: Three times a day (TID) | ORAL | 0 refills | Status: DC | PRN

## 2016-08-17 MED ORDER — AZITHROMYCIN 250 MG PO TABS: ORAL_TABLET | ORAL | 0 refills | Status: DC

## 2016-08-17 NOTE — Progress Notes (Signed)
Name: Traci Bowen   MRN: [id number]    DOB: Dec 04, 1940   Date:08/17/2016       Progress Note  Subjective  Chief Complaint  Chief Complaint  Patient presents with  . Sinusitis    head pressure with no relief, blowing green/ blood out of nose, sore throat, cough with green production    Sinusitis  This is a new problem. The current episode started in the past 7 days. The problem has been gradually worsening since onset. The maximum temperature recorded prior to her arrival was 100.4 - 100.9 F (one night). Associated symptoms include chills, congestion, coughing, headaches, neck pain, sinus pressure and a sore throat. Pertinent negatives include no diaphoresis, ear pain, hoarse voice, shortness of breath, sneezing or swollen glands. Past treatments include oral decongestants. The treatment provided no relief.    No problem-specific Assessment & Plan notes found for this encounter.   Past Medical History:  Diagnosis Date  . Hyperlipidemia   . Hypertension   . Neuropathy ([place])   . Renal disorder   . Spinal stenosis     Past Surgical History:  Procedure Laterality Date  . CATARACT EXTRACTION Bilateral   . EYE SURGERY Left march 2016   cysts removed   . LITHOTRIPSY    . VAGINAL HYSTERECTOMY      Family History  Problem Relation Age of Onset  . Diabetes Mother   . Hypertension Mother   . Cancer Father   . Cancer Brother     Social History   Social History  . Marital status: Widowed    Spouse name: N/A  . Number of children: N/A  . Years of education: N/A   Occupational History  . Not on file.   Social History Main Topics  . Smoking status: Former Research scientist (life sciences)  . Smokeless tobacco: Never Used  . Alcohol use No  . Drug use: No  . Sexual activity: No   Other Topics Concern  . Not on file   Social History Narrative  . No narrative on file    Allergies  Allergen Reactions  . Iodinated Diagnostic Agents   . Penicillins     Outpatient Medications Prior to Visit   Medication Sig Dispense Refill  . amLODipine (NORVASC) 10 MG tablet Take 1 tablet (10 mg total) by mouth daily. 90 tablet 1  . Cholecalciferol (VITAMIN D) 2000 units tablet Take by mouth.    . gabapentin (NEURONTIN) 300 MG capsule Take 1 capsule (300 mg total) by mouth at bedtime. 90 capsule 1  . Multiple Vitamins-Minerals (CENTRUM SILVER PO) Take 1 capsule by mouth daily at 6 (six) AM.    . Omega-3 Fatty Acids (FISH OIL) 1000 MG CAPS Take 1 capsule by mouth 2 (two) times daily.    . simvastatin (ZOCOR) 40 MG tablet Take 1 tablet (40 mg total) by mouth at bedtime. 90 tablet 1  . valsartan (DIOVAN) 160 MG tablet Take 1 tablet (160 mg total) by mouth daily. 90 tablet 1  . vitamin B-12 (CYANOCOBALAMIN) 1000 MCG tablet Take 1,000 mcg by mouth daily.    . Cholecalciferol (VITAMIN D3) 2000 units capsule Take by mouth.    . etodolac (LODINE) 400 MG tablet Take 1 tablet (400 mg total) by mouth 2 (two) times daily. 60 tablet 1  . guaiFENesin-codeine (ROBITUSSIN AC) 100-10 MG/5ML syrup Take 5 mLs by mouth 3 (three) times daily as needed for cough. 150 mL 0  . ibuprofen (ADVIL,MOTRIN) 800 MG tablet Take 1 tablet (800 mg total)  by mouth every 8 (eight) hours as needed. 30 tablet 0   No facility-administered medications prior to visit.     Review of Systems  Constitutional: Positive for chills. Negative for diaphoresis, fever, malaise/fatigue and weight loss.  HENT: Positive for congestion, sinus pressure and sore throat. Negative for ear discharge, ear pain, hoarse voice and sneezing.   Eyes: Negative for blurred vision.  Respiratory: Positive for cough. Negative for sputum production, shortness of breath and wheezing.   Cardiovascular: Negative for chest pain, palpitations and leg swelling.  Gastrointestinal: Negative for abdominal pain, blood in stool, constipation, diarrhea, heartburn, melena and nausea.  Genitourinary: Negative for dysuria, frequency, hematuria and urgency.  Musculoskeletal:  Positive for neck pain. Negative for back pain, joint pain and myalgias.  Skin: Negative for rash.  Neurological: Positive for headaches. Negative for dizziness, tingling, sensory change and focal weakness.  Endo/Heme/Allergies: Negative for environmental allergies and polydipsia. Does not bruise/bleed easily.  Psychiatric/Behavioral: Negative for depression and suicidal ideas. The patient is not nervous/anxious and does not have insomnia.      Objective  Vitals:   08/17/16 1347  BP: 138/70  Pulse: 80  Weight: 167 lb (75.8 kg)  Height: 5\' 6"  (1.676 m)    Physical Exam  Constitutional: She is well-developed, well-nourished, and in no distress. No distress.  HENT:  Head: Normocephalic and atraumatic.  Right Ear: Tympanic membrane, external ear and ear canal normal.  Left Ear: Tympanic membrane, external ear and ear canal normal.  Nose: Right sinus exhibits maxillary sinus tenderness. Right sinus exhibits no frontal sinus tenderness. Left sinus exhibits no maxillary sinus tenderness and no frontal sinus tenderness.  Mouth/Throat: Oropharynx is clear and moist.  Eyes: Conjunctivae and EOM are normal. Pupils are equal, round, and reactive to light. Right eye exhibits no discharge. Left eye exhibits no discharge.  Neck: Normal range of motion. Neck supple. No JVD present. No thyromegaly present.  Cardiovascular: Normal rate, regular rhythm, normal heart sounds and intact distal pulses.  Exam reveals no gallop and no friction rub.   No murmur heard. Pulmonary/Chest: Effort normal and breath sounds normal. No accessory muscle usage. No respiratory distress. She has no wheezes. She has no rales.  Abdominal: Soft. Bowel sounds are normal. She exhibits no mass. There is no tenderness. There is no guarding.  Musculoskeletal: Normal range of motion. She exhibits no edema.  Lymphadenopathy:    She has no cervical adenopathy.  Neurological: She is alert. She has normal reflexes.  Skin: Skin is  warm and dry. She is not diaphoretic.  Psychiatric: Mood and affect normal.  Nursing note and vitals reviewed.     Assessment & Plan  Problem List Items Addressed This Visit    None    Visit Diagnoses    Acute maxillary sinusitis, recurrence not specified    -  Primary   Relevant Medications   guaiFENesin-codeine (ROBITUSSIN AC) 100-10 MG/5ML syrup   azithromycin (ZITHROMAX) 250 MG tablet   Bronchitis       Relevant Medications   guaiFENesin-codeine (ROBITUSSIN AC) 100-10 MG/5ML syrup   azithromycin (ZITHROMAX) 250 MG tablet      Meds ordered this encounter  Medications  . guaiFENesin-codeine (ROBITUSSIN AC) 100-10 MG/5ML syrup    Sig: Take 5 mLs by mouth 3 (three) times daily as needed for cough.    Dispense:  150 mL    Refill:  0  . azithromycin (ZITHROMAX) 250 MG tablet    Sig: 2 today then 1 a day for 4  days    Dispense:  6 tablet    Refill:  0      Dr. Otilio Miu [facility]  08/17/16

## 2016-10-23 ENCOUNTER — Other Ambulatory Visit: Payer: Self-pay | Admitting: Family Medicine

## 2016-10-23 DIAGNOSIS — M48061 Spinal stenosis, lumbar region without neurogenic claudication: Secondary | ICD-10-CM

## 2016-10-23 DIAGNOSIS — I10 Essential (primary) hypertension: Secondary | ICD-10-CM

## 2017-01-18 ENCOUNTER — Other Ambulatory Visit: Payer: Self-pay

## 2017-01-18 ENCOUNTER — Telehealth: Payer: Self-pay | Admitting: Family Medicine

## 2017-01-18 MED ORDER — LOSARTAN POTASSIUM 50 MG PO TABS: 50.0000 mg | ORAL_TABLET | Freq: Every day | ORAL | 0 refills | Status: DC

## 2017-01-18 NOTE — Telephone Encounter (Signed)
Pt called and wanted to switch her valsartan to something different. Stay on Amlodipine and switch to Losartan 50mg  qday- recheck b/p in 3 weeks. Sent Losartan to [facility]

## 2017-01-24 ENCOUNTER — Other Ambulatory Visit: Payer: Self-pay | Admitting: Family Medicine

## 2017-01-24 DIAGNOSIS — I10 Essential (primary) hypertension: Secondary | ICD-10-CM

## 2017-01-24 DIAGNOSIS — M48061 Spinal stenosis, lumbar region without neurogenic claudication: Secondary | ICD-10-CM

## 2017-01-24 DIAGNOSIS — E785 Hyperlipidemia, unspecified: Secondary | ICD-10-CM

## 2017-02-13 ENCOUNTER — Ambulatory Visit (INDEPENDENT_AMBULATORY_CARE_PROVIDER_SITE_OTHER): Payer: Medicare Other | Admitting: Family Medicine

## 2017-02-13 ENCOUNTER — Encounter: Payer: Self-pay | Admitting: Family Medicine

## 2017-02-13 VITALS — BP 134/76 | HR 56 | Ht 66.0 in | Wt 169.0 lb

## 2017-02-13 DIAGNOSIS — E782 Mixed hyperlipidemia: Secondary | ICD-10-CM | POA: Diagnosis not present

## 2017-02-13 DIAGNOSIS — I10 Essential (primary) hypertension: Secondary | ICD-10-CM | POA: Diagnosis not present

## 2017-02-13 DIAGNOSIS — Z23 Encounter for immunization: Secondary | ICD-10-CM | POA: Diagnosis not present

## 2017-02-13 MED ORDER — AMLODIPINE BESYLATE 10 MG PO TABS: 10.0000 mg | ORAL_TABLET | Freq: Every day | ORAL | 1 refills | Status: DC

## 2017-02-13 MED ORDER — SIMVASTATIN 40 MG PO TABS: 40.0000 mg | ORAL_TABLET | Freq: Every day | ORAL | 1 refills | Status: DC

## 2017-02-13 NOTE — Progress Notes (Signed)
Name: Traci Bowen   MRN: [id number]    DOB: [birth year]   Date:02/13/2017       Progress Note  Subjective  Chief Complaint  Chief Complaint  Patient presents with  . Hypertension    changed over from valsartan to losartan- has a feeling of having to clear throat frequently  . Hyperlipidemia    Hypertension  This is a chronic problem. The current episode started more than 1 year ago. The problem has been gradually improving since onset. The problem is controlled. Pertinent negatives include no anxiety, blurred vision, chest pain, headaches, malaise/fatigue, neck pain, orthopnea, palpitations, peripheral edema, PND, shortness of breath or sweats. There are no associated agents to hypertension. There are no known risk factors for coronary artery disease. Past treatments include ACE inhibitors and calcium channel blockers. The current treatment provides moderate improvement. There are no compliance problems.  There is no history of angina, kidney disease, CAD/MI, CVA, heart failure, left ventricular hypertrophy, PVD or retinopathy. There is no history of chronic renal disease, a hypertension causing med or renovascular disease.  Hyperlipidemia  This is a chronic problem. The current episode started more than 1 year ago. The problem is controlled. Recent lipid tests were reviewed and are normal. She has no history of chronic renal disease. Pertinent negatives include no chest pain, focal weakness, leg pain, myalgias or shortness of breath. Current antihyperlipidemic treatment includes statins. The current treatment provides moderate improvement of lipids. There are no compliance problems.  There are no known risk factors for coronary artery disease.    No problem-specific Assessment & Plan notes found for this encounter.   Past Medical History:  Diagnosis Date  . Hyperlipidemia   . Hypertension   . Neuropathy   . Renal disorder   . Spinal stenosis     Past Surgical History:  Procedure  Laterality Date  . CATARACT EXTRACTION Bilateral   . EYE SURGERY Left march 2016   cysts removed   . LITHOTRIPSY    . VAGINAL HYSTERECTOMY      Family History  Problem Relation Age of Onset  . Diabetes Mother   . Hypertension Mother   . Cancer Father   . Cancer Brother     Social History   Social History  . Marital status: Widowed    Spouse name: N/A  . Number of children: N/A  . Years of education: N/A   Occupational History  . Not on file.   Social History Main Topics  . Smoking status: Former Research scientist (life sciences)  . Smokeless tobacco: Never Used  . Alcohol use No  . Drug use: No  . Sexual activity: No   Other Topics Concern  . Not on file   Social History Narrative  . No narrative on file    Allergies  Allergen Reactions  . Iodinated Diagnostic Agents   . Penicillins     Outpatient Medications Prior to Visit  Medication Sig Dispense Refill  . Cholecalciferol (VITAMIN D) 2000 units tablet Take by mouth.    . gabapentin (NEURONTIN) 300 MG capsule TAKE ONE CAPSULE AT BEDTIME 30 capsule 0  . Multiple Vitamins-Minerals (CENTRUM SILVER PO) Take 1 capsule by mouth daily at 6 (six) AM.    . Omega-3 Fatty Acids (FISH OIL) 1000 MG CAPS Take 1 capsule by mouth 2 (two) times daily.    . vitamin B-12 (CYANOCOBALAMIN) 1000 MCG tablet Take 1,000 mcg by mouth daily.    Marland Kitchen amLODipine (NORVASC) 10 MG tablet Take 1 tablet (10 mg  total) by mouth daily. 90 tablet 1  . losartan (COZAAR) 50 MG tablet Take 1 tablet (50 mg total) by mouth daily. 30 tablet 0  . simvastatin (ZOCOR) 40 MG tablet Take 1 tablet (40 mg total) by mouth at bedtime. 90 tablet 1  . amLODipine (NORVASC) 10 MG tablet TAKE ONE (1) TABLET BY MOUTH ONCE DAILY 30 tablet 0  . azithromycin (ZITHROMAX) 250 MG tablet 2 today then 1 a day for 4 days 6 tablet 0  . guaiFENesin-codeine (ROBITUSSIN AC) 100-10 MG/5ML syrup Take 5 mLs by mouth 3 (three) times daily as needed for cough. 150 mL 0  . simvastatin (ZOCOR) 40 MG tablet TAKE  ONE TABLET AT BEDTIME. 30 tablet 0   No facility-administered medications prior to visit.     Review of Systems  Constitutional: Negative for chills, fever, malaise/fatigue and weight loss.  HENT: Negative for ear discharge, ear pain and sore throat.   Eyes: Negative for blurred vision.  Respiratory: Negative for cough, sputum production, shortness of breath and wheezing.   Cardiovascular: Negative for chest pain, palpitations, orthopnea, leg swelling and PND.  Gastrointestinal: Negative for abdominal pain, blood in stool, constipation, diarrhea, heartburn, melena and nausea.  Genitourinary: Negative for dysuria, frequency, hematuria and urgency.  Musculoskeletal: Negative for back pain, joint pain, myalgias and neck pain.  Skin: Negative for rash.  Neurological: Negative for dizziness, tingling, sensory change, focal weakness and headaches.  Endo/Heme/Allergies: Negative for environmental allergies and polydipsia. Does not bruise/bleed easily.  Psychiatric/Behavioral: Negative for depression and suicidal ideas. The patient is not nervous/anxious and does not have insomnia.      Objective  Vitals:   02/13/17 1019  BP: 134/76  Pulse: (!) 56  Weight: 169 lb (76.7 kg)  Height: 5\' 6"  (1.676 m)    Physical Exam  Constitutional: She is well-developed, well-nourished, and in no distress. No distress.  HENT:  Head: Normocephalic and atraumatic.  Right Ear: External ear normal.  Left Ear: External ear normal.  Nose: Nose normal.  Mouth/Throat: Oropharynx is clear and moist.  Eyes: Pupils are equal, round, and reactive to light. Conjunctivae and EOM are normal. Right eye exhibits no discharge. Left eye exhibits no discharge.  Neck: Normal range of motion. Neck supple. No JVD present. No thyromegaly present.  Cardiovascular: Normal rate, regular rhythm, normal heart sounds and intact distal pulses.  Exam reveals no gallop and no friction rub.   No murmur heard. Pulmonary/Chest: Effort  normal and breath sounds normal. She has no wheezes. She has no rales.  Abdominal: Soft. Bowel sounds are normal. She exhibits no mass. There is no tenderness. There is no guarding.  Musculoskeletal: Normal range of motion. She exhibits no edema.  Lymphadenopathy:    She has no cervical adenopathy.  Neurological: She is alert. She has normal reflexes.  Skin: Skin is warm and dry. She is not diaphoretic.  Psychiatric: Mood and affect normal.  Nursing note and vitals reviewed.     Assessment & Plan  Problem List Items Addressed This Visit      Cardiovascular and Mediastinum   Essential hypertension - Primary   Relevant Medications   amLODipine (NORVASC) 10 MG tablet   simvastatin (ZOCOR) 40 MG tablet   Other Relevant Orders   Renal Function Panel     Other   Hyperlipidemia   Relevant Medications   amLODipine (NORVASC) 10 MG tablet   simvastatin (ZOCOR) 40 MG tablet   Other Relevant Orders   Lipid Profile    Other  Visit Diagnoses    Influenza vaccine needed       Relevant Orders   Flu Vaccine QUAD 36+ mos IM (Completed)      Meds ordered this encounter  Medications  . amLODipine (NORVASC) 10 MG tablet    Sig: Take 1 tablet (10 mg total) by mouth daily.    Dispense:  90 tablet    Refill:  1  . simvastatin (ZOCOR) 40 MG tablet    Sig: Take 1 tablet (40 mg total) by mouth at bedtime.    Dispense:  90 tablet    Refill:  1      Dr. Otilio Miu [facility]  02/13/17

## 2017-02-14 LAB — LIPID PANEL
CHOLESTEROL TOTAL: 149 mg/dL (ref 100–199)
Chol/HDL Ratio: 5 ratio — ABNORMAL HIGH (ref 0.0–4.4)
HDL: 30 mg/dL — ABNORMAL LOW (ref >39)
LDL Calculated: 75 mg/dL (ref 0–99)
TRIGLYCERIDES: 222 mg/dL — AB (ref 0–149)
VLDL Cholesterol Cal: 44 mg/dL — ABNORMAL HIGH (ref 5–40)

## 2017-02-14 LAB — RENAL FUNCTION PANEL
Albumin: 4.6 g/dL (ref 3.5–4.8)
BUN/Creatinine Ratio: 12 (ref 12–28)
BUN: 8 mg/dL (ref 8–27)
CALCIUM: 10.3 mg/dL (ref 8.7–10.3)
CHLORIDE: 101 mmol/L (ref 96–106)
CO2: 25 mmol/L (ref 20–29)
Creatinine, Ser: 0.66 mg/dL (ref 0.57–1.00)
GFR calc Af Amer: 100 mL/min/{1.73_m2} (ref >59)
GFR calc non Af Amer: 87 mL/min/{1.73_m2} (ref >59)
GLUCOSE: 108 mg/dL — AB (ref 65–99)
PHOSPHORUS: 3 mg/dL (ref 2.5–4.5)
POTASSIUM: 4.3 mmol/L (ref 3.5–5.2)
SODIUM: 144 mmol/L (ref 134–144)

## 2017-02-21 ENCOUNTER — Encounter: Payer: Self-pay | Admitting: Family Medicine

## 2017-02-21 ENCOUNTER — Ambulatory Visit (INDEPENDENT_AMBULATORY_CARE_PROVIDER_SITE_OTHER): Payer: Medicare Other | Admitting: Family Medicine

## 2017-02-21 ENCOUNTER — Other Ambulatory Visit: Payer: Self-pay | Admitting: Family Medicine

## 2017-02-21 VITALS — BP 142/70 | HR 80 | Ht 66.0 in | Wt 170.0 lb

## 2017-02-21 DIAGNOSIS — H9319 Tinnitus, unspecified ear: Secondary | ICD-10-CM | POA: Diagnosis not present

## 2017-02-21 DIAGNOSIS — I10 Essential (primary) hypertension: Secondary | ICD-10-CM | POA: Diagnosis not present

## 2017-02-21 DIAGNOSIS — R42 Dizziness and giddiness: Secondary | ICD-10-CM | POA: Diagnosis not present

## 2017-02-21 DIAGNOSIS — M48061 Spinal stenosis, lumbar region without neurogenic claudication: Secondary | ICD-10-CM

## 2017-02-21 MED ORDER — LOSARTAN POTASSIUM 25 MG PO TABS: 25.0000 mg | ORAL_TABLET | Freq: Every day | ORAL | 1 refills | Status: DC

## 2017-02-21 NOTE — Progress Notes (Signed)
Name: Traci Bowen   MRN: [id number]    DOB: 02/01/1941   Date:02/21/2017       Progress Note  Subjective  Chief Complaint  Chief Complaint  Patient presents with  . Hypertension    dropped losartan 50mg  and cont Amlodipine 10mg - B/P was elevated- does she need to go back on losartan    Hypertension  This is a chronic problem. The current episode started more than 1 year ago. The problem has been waxing and waning since onset. The problem is controlled. Pertinent negatives include no anxiety, blurred vision, chest pain, headaches, malaise/fatigue, neck pain, orthopnea, palpitations, peripheral edema, PND, shortness of breath or sweats. There are no associated agents to hypertension. Risk factors for coronary artery disease include dyslipidemia. Past treatments include calcium channel blockers. There are no compliance problems.  There is no history of angina, kidney disease, CAD/MI, CVA, heart failure, left ventricular hypertrophy, PVD or retinopathy. There is no history of chronic renal disease, a hypertension causing med or renovascular disease.    No problem-specific Assessment & Plan notes found for this encounter.   Past Medical History:  Diagnosis Date  . Hyperlipidemia   . Hypertension   . Neuropathy   . Renal disorder   . Spinal stenosis     Past Surgical History:  Procedure Laterality Date  . CATARACT EXTRACTION Bilateral   . EYE SURGERY Left march 2016   cysts removed   . LITHOTRIPSY    . VAGINAL HYSTERECTOMY      Family History  Problem Relation Age of Onset  . Diabetes Mother   . Hypertension Mother   . Cancer Father   . Cancer Brother     Social History   Social History  . Marital status: Widowed    Spouse name: N/A  . Number of children: N/A  . Years of education: N/A   Occupational History  . Not on file.   Social History Main Topics  . Smoking status: Former Research scientist (life sciences)  . Smokeless tobacco: Never Used  . Alcohol use No  . Drug use: No  . Sexual  activity: No   Other Topics Concern  . Not on file   Social History Narrative  . No narrative on file    Allergies  Allergen Reactions  . Iodinated Diagnostic Agents   . Penicillins     Outpatient Medications Prior to Visit  Medication Sig Dispense Refill  . amLODipine (NORVASC) 10 MG tablet Take 1 tablet (10 mg total) by mouth daily. 90 tablet 1  . Cholecalciferol (VITAMIN D) 2000 units tablet Take by mouth.    . gabapentin (NEURONTIN) 300 MG capsule TAKE ONE CAPSULE AT BEDTIME 30 capsule 0  . Multiple Vitamins-Minerals (CENTRUM SILVER PO) Take 1 capsule by mouth daily at 6 (six) AM.    . Omega-3 Fatty Acids (FISH OIL) 1000 MG CAPS Take 1 capsule by mouth 2 (two) times daily.    . simvastatin (ZOCOR) 40 MG tablet Take 1 tablet (40 mg total) by mouth at bedtime. 90 tablet 1  . vitamin B-12 (CYANOCOBALAMIN) 1000 MCG tablet Take 1,000 mcg by mouth daily.     No facility-administered medications prior to visit.     Review of Systems  Constitutional: Negative for chills, fever, malaise/fatigue and weight loss.  HENT: Positive for tinnitus. Negative for ear discharge, ear pain and sore throat.   Eyes: Negative for blurred vision.  Respiratory: Negative for cough, sputum production, shortness of breath and wheezing.   Cardiovascular: Negative for chest  pain, palpitations, orthopnea, leg swelling and PND.  Gastrointestinal: Negative for abdominal pain, blood in stool, constipation, diarrhea, heartburn, melena and nausea.  Genitourinary: Negative for dysuria, frequency, hematuria and urgency.  Musculoskeletal: Negative for back pain, joint pain, myalgias and neck pain.  Skin: Negative for rash.  Neurological: Positive for dizziness. Negative for tingling, sensory change, focal weakness and headaches.  Endo/Heme/Allergies: Negative for environmental allergies and polydipsia. Does not bruise/bleed easily.  Psychiatric/Behavioral: Negative for depression and suicidal ideas. The patient  is not nervous/anxious and does not have insomnia.      Objective  Vitals:   02/21/17 1401  BP: (!) 142/70  Pulse: 80  Weight: 170 lb (77.1 kg)  Height: 5\' 6"  (1.676 m)    Physical Exam  Constitutional: She is well-developed, well-nourished, and in no distress. No distress.  HENT:  Head: Normocephalic and atraumatic.  Right Ear: External ear normal.  Left Ear: External ear normal.  Nose: Nose normal.  Mouth/Throat: Oropharynx is clear and moist.  Eyes: Pupils are equal, round, and reactive to light. Conjunctivae and EOM are normal. Right eye exhibits no discharge. Left eye exhibits no discharge.  Neck: Normal range of motion. Neck supple. No JVD present. No thyromegaly present.  Cardiovascular: Normal rate, regular rhythm, normal heart sounds and intact distal pulses.  Exam reveals no gallop and no friction rub.   No murmur heard. Pulmonary/Chest: Effort normal and breath sounds normal. She has no wheezes. She has no rales.  Abdominal: Soft. Bowel sounds are normal. She exhibits no mass. There is no tenderness. There is no guarding.  Musculoskeletal: Normal range of motion. She exhibits no edema.  Lymphadenopathy:    She has no cervical adenopathy.  Neurological: She is alert. She has normal reflexes. No cranial nerve deficit. Coordination normal.  Skin: Skin is warm and dry. She is not diaphoretic.  Psychiatric: Mood and affect normal.  Nursing note and vitals reviewed.     Assessment & Plan  Problem List Items Addressed This Visit      Cardiovascular and Mediastinum   Essential hypertension - Primary   Relevant Medications   losartan (COZAAR) 25 MG tablet    Other Visit Diagnoses    Tinnitus, unspecified laterality       one episode no loss of consciousnes   Dizziness       see above      Meds ordered this encounter  Medications  . losartan (COZAAR) 25 MG tablet    Sig: Take 1 tablet (25 mg total) by mouth daily.    Dispense:  30 tablet    Refill:  1       Dr. [facility]  02/21/17

## 2017-03-06 ENCOUNTER — Ambulatory Visit: Payer: Medicare Other | Admitting: Family Medicine

## 2017-03-21 ENCOUNTER — Encounter: Payer: Self-pay | Admitting: Family Medicine

## 2017-03-21 ENCOUNTER — Ambulatory Visit (INDEPENDENT_AMBULATORY_CARE_PROVIDER_SITE_OTHER): Payer: Medicare Other | Admitting: Family Medicine

## 2017-03-21 VITALS — BP 138/70 | HR 60 | Ht 66.0 in | Wt 168.0 lb

## 2017-03-21 DIAGNOSIS — Z13820 Encounter for screening for osteoporosis: Secondary | ICD-10-CM | POA: Diagnosis not present

## 2017-03-21 DIAGNOSIS — F5101 Primary insomnia: Secondary | ICD-10-CM | POA: Diagnosis not present

## 2017-03-21 DIAGNOSIS — I10 Essential (primary) hypertension: Secondary | ICD-10-CM | POA: Diagnosis not present

## 2017-03-21 DIAGNOSIS — E782 Mixed hyperlipidemia: Secondary | ICD-10-CM

## 2017-03-21 DIAGNOSIS — E2839 Other primary ovarian failure: Secondary | ICD-10-CM

## 2017-03-21 DIAGNOSIS — G629 Polyneuropathy, unspecified: Secondary | ICD-10-CM | POA: Diagnosis not present

## 2017-03-21 DIAGNOSIS — M48061 Spinal stenosis, lumbar region without neurogenic claudication: Secondary | ICD-10-CM | POA: Diagnosis not present

## 2017-03-21 MED ORDER — LOSARTAN POTASSIUM 25 MG PO TABS: 25.0000 mg | ORAL_TABLET | Freq: Every day | ORAL | 1 refills | Status: DC

## 2017-03-21 MED ORDER — AMLODIPINE BESYLATE 10 MG PO TABS: 10.0000 mg | ORAL_TABLET | Freq: Every day | ORAL | 1 refills | Status: DC

## 2017-03-21 MED ORDER — SIMVASTATIN 40 MG PO TABS: 40.0000 mg | ORAL_TABLET | Freq: Every day | ORAL | 1 refills | Status: DC

## 2017-03-21 MED ORDER — GABAPENTIN 300 MG PO CAPS: 300.0000 mg | ORAL_CAPSULE | Freq: Every day | ORAL | 1 refills | Status: DC

## 2017-03-21 NOTE — Progress Notes (Signed)
Name: Traci Bowen   MRN: [id number]    DOB: 1941/03/08   Date:03/21/2017       Progress Note  Subjective  Chief Complaint  Chief Complaint  Patient presents with  . Follow-up    decreased Losartan from 50 to 25mg  due to cough- cough is better    Hypertension  This is a chronic problem. The current episode started more than 1 year ago. The problem is unchanged. The problem is controlled. Pertinent negatives include no anxiety, blurred vision, chest pain, headaches, malaise/fatigue, neck pain, orthopnea, palpitations, peripheral edema, PND, shortness of breath or sweats. There are no associated agents to hypertension. There are no known risk factors for coronary artery disease. Past treatments include angiotensin blockers. The current treatment provides moderate improvement. There are no compliance problems.  There is no history of angina, kidney disease, CAD/MI, CVA, heart failure, left ventricular hypertrophy, PVD or retinopathy. There is no history of chronic renal disease, a hypertension causing med or renovascular disease.  Insomnia  Primary symptoms: no fragmented sleep, difficulty falling asleep, no somnolence, no malaise/fatigue.  The onset quality is gradual. The problem has been waxing and waning since onset. PMH includes: no depression.    No problem-specific Assessment & Plan notes found for this encounter.   Past Medical History:  Diagnosis Date  . Hyperlipidemia   . Hypertension   . Neuropathy   . Renal disorder   . Spinal stenosis     Past Surgical History:  Procedure Laterality Date  . CATARACT EXTRACTION Bilateral   . EYE SURGERY Left march 2016   cysts removed   . LITHOTRIPSY    . VAGINAL HYSTERECTOMY      Family History  Problem Relation Age of Onset  . Diabetes Mother   . Hypertension Mother   . Cancer Father   . Cancer Brother     Social History   Social History  . Marital status: Widowed    Spouse name: N/A  . Number of children: N/A  . Years  of education: N/A   Occupational History  . Not on file.   Social History Main Topics  . Smoking status: Former Research scientist (life sciences)  . Smokeless tobacco: Never Used  . Alcohol use No  . Drug use: No  . Sexual activity: No   Other Topics Concern  . Not on file   Social History Narrative  . No narrative on file    Allergies  Allergen Reactions  . Iodinated Diagnostic Agents   . Penicillins     Outpatient Medications Prior to Visit  Medication Sig Dispense Refill  . Cholecalciferol (VITAMIN D) 2000 units tablet Take by mouth.    . Multiple Vitamins-Minerals (CENTRUM SILVER PO) Take 1 capsule by mouth daily at 6 (six) AM.    . Omega-3 Fatty Acids (FISH OIL) 1000 MG CAPS Take 1 capsule by mouth 2 (two) times daily.    . vitamin B-12 (CYANOCOBALAMIN) 1000 MCG tablet Take 1,000 mcg by mouth daily.    Marland Kitchen amLODipine (NORVASC) 10 MG tablet Take 1 tablet (10 mg total) by mouth daily. 90 tablet 1  . gabapentin (NEURONTIN) 300 MG capsule TAKE ONE CAPSULE AT BEDTIME 30 capsule 0  . losartan (COZAAR) 25 MG tablet Take 1 tablet (25 mg total) by mouth daily. 30 tablet 1  . simvastatin (ZOCOR) 40 MG tablet Take 1 tablet (40 mg total) by mouth at bedtime. 90 tablet 1   No facility-administered medications prior to visit.     Review of Systems  Constitutional: Negative for chills, fever, malaise/fatigue and weight loss.  HENT: Negative for ear discharge, ear pain and sore throat.   Eyes: Negative for blurred vision.  Respiratory: Negative for cough, sputum production, shortness of breath and wheezing.   Cardiovascular: Negative for chest pain, palpitations, orthopnea, leg swelling and PND.  Gastrointestinal: Negative for abdominal pain, blood in stool, constipation, diarrhea, heartburn, melena and nausea.  Genitourinary: Negative for dysuria, frequency, hematuria and urgency.  Musculoskeletal: Negative for back pain, joint pain, myalgias and neck pain.  Skin: Negative for rash.  Neurological: Negative  for dizziness, tingling, sensory change, focal weakness and headaches.  Endo/Heme/Allergies: Negative for environmental allergies and polydipsia. Does not bruise/bleed easily.  Psychiatric/Behavioral: Negative for depression and suicidal ideas. The patient has insomnia. The patient is not nervous/anxious.      Objective  Vitals:   03/21/17 0908  BP: 138/70  Pulse: 60  Weight: 168 lb (76.2 kg)  Height: 5\' 6"  (1.676 m)    Physical Exam  Constitutional: She is well-developed, well-nourished, and in no distress. No distress.  HENT:  Head: Normocephalic and atraumatic.  Right Ear: External ear normal.  Left Ear: External ear normal.  Nose: Nose normal.  Mouth/Throat: Oropharynx is clear and moist.  Eyes: Pupils are equal, round, and reactive to light. Conjunctivae and EOM are normal. Right eye exhibits no discharge. Left eye exhibits no discharge.  Neck: Normal range of motion. Neck supple. No JVD present. No thyromegaly present.  Cardiovascular: Normal rate, regular rhythm, normal heart sounds and intact distal pulses.  Exam reveals no gallop and no friction rub.   No murmur heard. Pulmonary/Chest: Effort normal and breath sounds normal. She has no wheezes. She has no rales.  Abdominal: Soft. Bowel sounds are normal. She exhibits no mass. There is no tenderness. There is no guarding.  Musculoskeletal: Normal range of motion. She exhibits no edema.  Lymphadenopathy:    She has no cervical adenopathy.  Neurological: She is alert.  Skin: Skin is warm and dry. She is not diaphoretic.  Psychiatric: Mood and affect normal.  Nursing note and vitals reviewed.     Assessment & Plan  Problem List Items Addressed This Visit      Cardiovascular and Mediastinum   Essential hypertension - Primary   Relevant Medications   amLODipine (NORVASC) 10 MG tablet   losartan (COZAAR) 25 MG tablet   simvastatin (ZOCOR) 40 MG tablet     Nervous and Auditory   Neuropathy     Other    Hyperlipidemia   Relevant Medications   amLODipine (NORVASC) 10 MG tablet   losartan (COZAAR) 25 MG tablet   simvastatin (ZOCOR) 40 MG tablet    Other Visit Diagnoses    Spinal stenosis of lumbar region, unspecified whether neurogenic claudication present       Relevant Medications   gabapentin (NEURONTIN) 300 MG capsule   Primary insomnia       trial melatonin   Osteoporosis screening       Relevant Orders   DG Bone Density   Ovarian failure       Relevant Orders   DG Bone Density      Meds ordered this encounter  Medications  . amLODipine (NORVASC) 10 MG tablet    Sig: Take 1 tablet (10 mg total) by mouth daily.    Dispense:  90 tablet    Refill:  1  . losartan (COZAAR) 25 MG tablet    Sig: Take 1 tablet (25 mg total) by mouth  daily.    Dispense:  90 tablet    Refill:  1  . simvastatin (ZOCOR) 40 MG tablet    Sig: Take 1 tablet (40 mg total) by mouth at bedtime.    Dispense:  90 tablet    Refill:  1  . gabapentin (NEURONTIN) 300 MG capsule    Sig: Take 1 capsule (300 mg total) by mouth at bedtime.    Dispense:  90 capsule    Refill:  1      Dr. Otilio Miu [facility]  03/21/17

## 2017-03-21 NOTE — Patient Instructions (Signed)
Melatonin oral solid dosage forms What is this medicine? MELATONIN (mel uh TOH nin) is a dietary supplement. It is mostly promoted to help maintain normal sleep patterns. The FDA has not approved this supplement for any medical use. This supplement may be used for other purposes; ask your health care provider or pharmacist if you have questions. This medicine may be used for other purposes; ask your health care provider or pharmacist if you have questions. COMMON BRAND NAME(S): Melatonex What should I tell my health care provider before I take this medicine? They need to know if you have any of these conditions: -cancer -depression or mental illness -diabetes -hormone problems -if you often drink alcohol -immune system problems -liver disease -lung or breathing disease, like asthma -organ transplant -seizure disorder -an unusual or allergic reaction to melatonin, other medicines, foods, dyes, or preservatives -pregnant or trying to get pregnant -breast-feeding How should I use this medicine? Take this supplement by mouth with a glass of water. Do not take with food. This supplement is usually taken 1 or 2 hours before bedtime. After taking this supplement, limit your activities to those needed to prepare for bed. Some products may be chewed or dissolved in the mouth before swallowing. Some tablets or capsules must be swallowed whole; do not cut, crush or chew. Follow the directions on the package labeling, or take as directed by your health care professional. Do not take this supplement more often than directed. Talk to your pediatrician regarding the use of this supplement in children. Special care may be needed. This supplement is not recommended for use in children without a prescription. Overdosage: If you think you have taken too much of this medicine contact a poison control center or emergency room at once. NOTE: This medicine is only for you. Do not share this medicine with  others. What if I miss a dose? If you miss taking your dose at the usual time, skip that dose. If it is almost time for your next dose, take only that dose. Do not take double or extra doses. What may interact with this medicine? Do not take this medicine with any of the following medications: -fluvoxamine -ramelteon -tasimelteon This medicine may also interact with the following medications: -alcohol -caffeine -carbamazepine -certain antibiotics like ciprofloxacin -certain medicines for depression, anxiety, or psychotic disturbances -cimetidine -female hormones, like estrogens and birth control pills, patches, rings, or injections -methoxsalen -nifedipine -other medications for sleep -other herbal or dietary supplements -phenobarbital -rifampin -smoking tobacco -tamoxifen -warfarin This list may not describe all possible interactions. Give your health care provider a list of all the medicines, herbs, non-prescription drugs, or dietary supplements you use. Also tell them if you smoke, drink alcohol, or use illegal drugs. Some items may interact with your medicine. What should I watch for while using this medicine? See your doctor if your symptoms do not get better or if they get worse. Do not take this supplement for more than 2 weeks unless your doctor tells you to. You may get drowsy or dizzy. Do not drive, use machinery, or do anything that needs mental alertness until you know how this medicine affects you. Do not stand or sit up quickly, especially if you are an older patient. This reduces the risk of dizzy or fainting spells. Alcohol may interfere with the effect of this medicine. Avoid alcoholic drinks. Talk to your doctor before you use this supplement if you are currently being treated for an emotional, mental, or sleep problem. This medicine   may interfere with your treatment. Herbal or dietary supplements are not regulated like medicines. Rigid quality control standards are  not required for dietary supplements. The purity and strength of these products can vary. The safety and effect of this dietary supplement for a certain disease or illness is not well known. This product is not intended to diagnose, treat, cure or prevent any disease. The Food and Drug Administration suggests the following to help consumers protect themselves: -Always read product labels and follow directions. -Natural does not mean a product is safe for humans to take. -Look for products that include USP after the ingredient name. This means that the manufacturer followed the standards of the [place] Pharmacopoeia. -Supplements made or sold by a nationally known food or drug company are more likely to be made under tight controls. You can write to the company for more information about how the product was made. What side effects may I notice from receiving this medicine? Side effects that you should report to your doctor or health care professional as soon as possible: -allergic reactions like skin rash, itching or hives, swelling of the face, lips, or tongue -breathing problems -confusion -depressed mood, irritable, or other changes in moods or behaviors -feeling faint or lightheaded, falls -increased blood pressure -irregular or missed menstrual periods -signs and symptoms of liver injury like dark yellow or brown urine; general ill feeling or flu-like symptoms; light-colored stools; loss of appetite; nausea; right upper belly pain; unusually weak or tired; yellowing of the eyes or skin -trouble staying awake or alert during the day -unusual activities while you are still asleep like driving, eating, making phone calls -unusual bleeding or bruising Side effects that usually do not require medical attention (report to your doctor or health care professional if they continue or are bothersome): -dizziness -drowsiness -headache -hot flashes -nausea -tiredness -unusual dreams or  nightmares -upset stomach This list may not describe all possible side effects. Call your doctor for medical advice about side effects. You may report side effects to FDA at 1-800-FDA-1088. Where should I keep my medicine? Keep out of the reach of children. Store at room temperature or as directed on the package label. Protect from moisture. Throw away any unused supplement after the expiration date. NOTE: This sheet is a summary. It may not cover all possible information. If you have questions about this medicine, talk to your doctor, pharmacist, or health care provider.  2018 Elsevier/Gold Standard (2016-02-06 14:38:22)  

## 2017-04-15 ENCOUNTER — Ambulatory Visit
Admission: RE | Admit: 2017-04-15 | Discharge: 2017-04-15 | Disposition: A | Discharge status: Home / Self Care | Payer: Medicare Other | Source: Ambulatory Visit | Attending: Family Medicine | Admitting: Family Medicine

## 2017-04-15 DIAGNOSIS — Z13820 Encounter for screening for osteoporosis: Secondary | ICD-10-CM | POA: Diagnosis not present

## 2017-04-15 DIAGNOSIS — E2839 Other primary ovarian failure: Secondary | ICD-10-CM

## 2017-04-15 DIAGNOSIS — M818 Other osteoporosis without current pathological fracture: Secondary | ICD-10-CM | POA: Insufficient documentation

## 2017-04-15 DIAGNOSIS — Z780 Asymptomatic menopausal state: Secondary | ICD-10-CM | POA: Diagnosis not present

## 2017-04-15 DIAGNOSIS — M810 Age-related osteoporosis without current pathological fracture: Secondary | ICD-10-CM | POA: Diagnosis not present

## 2017-06-14 ENCOUNTER — Emergency Department
Admission: EM | Admit: 2017-06-14 | Discharge: 2017-06-14 | Disposition: A | Discharge status: Home / Self Care | Payer: Medicare Other | Attending: Emergency Medicine | Admitting: Emergency Medicine

## 2017-06-14 ENCOUNTER — Encounter: Payer: Self-pay | Admitting: Emergency Medicine

## 2017-06-14 ENCOUNTER — Other Ambulatory Visit: Payer: Self-pay

## 2017-06-14 DIAGNOSIS — Z87891 Personal history of nicotine dependence: Secondary | ICD-10-CM | POA: Insufficient documentation

## 2017-06-14 DIAGNOSIS — E876 Hypokalemia: Secondary | ICD-10-CM | POA: Diagnosis not present

## 2017-06-14 DIAGNOSIS — I10 Essential (primary) hypertension: Secondary | ICD-10-CM | POA: Diagnosis not present

## 2017-06-14 DIAGNOSIS — Z79899 Other long term (current) drug therapy: Secondary | ICD-10-CM | POA: Diagnosis not present

## 2017-06-14 DIAGNOSIS — R55 Syncope and collapse: Secondary | ICD-10-CM | POA: Diagnosis not present

## 2017-06-14 LAB — BASIC METABOLIC PANEL
ANION GAP: 11 (ref 5–15)
BUN: 9 mg/dL (ref 6–20)
CO2: 28 mmol/L (ref 22–32)
Calcium: 9.6 mg/dL (ref 8.9–10.3)
Chloride: 103 mmol/L (ref 101–111)
Creatinine, Ser: 0.66 mg/dL (ref 0.44–1.00)
Glucose, Bld: 140 mg/dL — ABNORMAL HIGH (ref 65–99)
Potassium: 2.8 mmol/L — ABNORMAL LOW (ref 3.5–5.1)
SODIUM: 142 mmol/L (ref 135–145)

## 2017-06-14 LAB — URINALYSIS, COMPLETE (UACMP) WITH MICROSCOPIC
BILIRUBIN URINE: NEGATIVE
Bacteria, UA: NONE SEEN
GLUCOSE, UA: NEGATIVE mg/dL
HGB URINE DIPSTICK: NEGATIVE
Ketones, ur: NEGATIVE mg/dL
Leukocytes, UA: NEGATIVE
NITRITE: NEGATIVE
PROTEIN: NEGATIVE mg/dL
RBC / HPF: NONE SEEN RBC/hpf (ref 0–5)
SPECIFIC GRAVITY, URINE: 1.003 — AB (ref 1.005–1.030)
Squamous Epithelial / LPF: NONE SEEN
pH: 7 (ref 5.0–8.0)

## 2017-06-14 LAB — CBC WITH DIFFERENTIAL/PLATELET
BASOS ABS: 0.1 10*3/uL (ref 0–0.1)
BASOS PCT: 1 %
Eosinophils Absolute: 0.1 10*3/uL (ref 0–0.7)
Eosinophils Relative: 2 %
HEMATOCRIT: 45.6 % (ref 35.0–47.0)
HEMOGLOBIN: 15.6 g/dL (ref 12.0–16.0)
Lymphocytes Relative: 41 %
Lymphs Abs: 3.6 10*3/uL (ref 1.0–3.6)
MCH: 29.6 pg (ref 26.0–34.0)
MCHC: 34.2 g/dL (ref 32.0–36.0)
MCV: 86.6 fL (ref 80.0–100.0)
Monocytes Absolute: 0.6 10*3/uL (ref 0.2–0.9)
Monocytes Relative: 7 %
NEUTROS ABS: 4.4 10*3/uL (ref 1.4–6.5)
NEUTROS PCT: 49 %
Platelets: 279 10*3/uL (ref 150–440)
RBC: 5.26 MIL/uL — ABNORMAL HIGH (ref 3.80–5.20)
RDW: 13.7 % (ref 11.5–14.5)
WBC: 8.8 10*3/uL (ref 3.6–11.0)

## 2017-06-14 LAB — TROPONIN I

## 2017-06-14 MED ORDER — POTASSIUM CHLORIDE CRYS ER 20 MEQ PO TBCR
40.0000 meq | EXTENDED_RELEASE_TABLET | Freq: Once | ORAL | Status: AC
  Administered 2017-06-14: 40 meq via ORAL
  Filled 2017-06-14: qty 2

## 2017-06-14 NOTE — ED Provider Notes (Signed)
[facility] Emergency Department Provider Note  ____________________________________________  Time seen: Approximately 12:45 PM  I have reviewed the triage vital signs and the nursing notes.   HISTORY  Chief Complaint Loss of Consciousness    HPI Traci Bowen is a 77 y.o. female who was in her usual state of health when she is walking around in [place] and suddenly felt lightheaded and knew she is to pass out. She called out for help, and then lost consciousness briefly. Denies any preceding symptoms other than lightheadedness. No chest pain shortness breath abdominal pain or back pain. No recent illness. Eating and drinking normally and had a normal breakfast this morning. Symptoms quickly resolved, no recurrence. No aggravating or alleviating factors. No radiating pain.  After recovering from the loss of consciousness, she has had absolutely no symptoms.  Orthostatic vital signs negative.     Past Medical History:  Diagnosis Date  . Hyperlipidemia   . Hypertension   . Neuropathy   . Renal disorder   . Spinal stenosis      Patient Active Problem List   Diagnosis Date Noted  . Neuropathy 01/06/2016  . Essential hypertension 06/01/2015  . Hyperlipidemia 06/01/2015     Past Surgical History:  Procedure Laterality Date  . CATARACT EXTRACTION Bilateral   . EYE SURGERY Left march 2016   cysts removed   . LITHOTRIPSY    . VAGINAL HYSTERECTOMY       Prior to Admission medications   Medication Sig Start Date End Date Taking? Authorizing Provider  amLODipine (NORVASC) 10 MG tablet Take 1 tablet (10 mg total) by mouth daily. 03/21/17   Juline Patch, MD  Cholecalciferol (VITAMIN D) 2000 units tablet Take by mouth.    [provider]  gabapentin (NEURONTIN) 300 MG capsule Take 1 capsule (300 mg total) by mouth at bedtime. 03/21/17   Juline Patch, MD  losartan (COZAAR) 25 MG tablet Take 1 tablet (25 mg total) by mouth daily. 03/21/17    Juline Patch, MD  Multiple Vitamins-Minerals (CENTRUM SILVER PO) Take 1 capsule by mouth daily at 6 (six) AM.    [provider]  Omega-3 Fatty Acids (FISH OIL) 1000 MG CAPS Take 1 capsule by mouth 2 (two) times daily.    [provider]  simvastatin (ZOCOR) 40 MG tablet Take 1 tablet (40 mg total) by mouth at bedtime. 03/21/17   Juline Patch, MD  vitamin B-12 (CYANOCOBALAMIN) 1000 MCG tablet Take 1,000 mcg by mouth daily.    [provider]     Allergies Iodinated diagnostic agents and Penicillins   Family History  Problem Relation Age of Onset  . Diabetes Mother   . Hypertension Mother   . Cancer Father   . Cancer Brother     Social History Social History   Tobacco Use  . Smoking status: Former Research scientist (life sciences)  . Smokeless tobacco: Never Used  Substance Use Topics  . Alcohol use: No    Alcohol/week: 0.0 oz  . Drug use: No    Review of Systems  Constitutional:   No fever or chills.   Cardiovascular:   No chest pain or syncope. Respiratory:   No dyspnea or cough. Gastrointestinal:   Negative for abdominal pain, vomiting and diarrhea.  Musculoskeletal:   Negative for focal pain or swelling All other systems reviewed and are negative except as documented above in ROS and HPI.  ____________________________________________   PHYSICAL EXAM:  VITAL SIGNS: ED Triage Vitals  Enc Vitals  Group     BP 06/14/17 1200 (!) 159/82     Pulse Rate 06/14/17 1200 78     Resp 06/14/17 1200 19     Temp 06/14/17 1200 (!) 97.5 F (36.4 C)     Temp Source 06/14/17 1200 Oral     SpO2 06/14/17 1200 99 %     Weight 06/14/17 1202 157 lb (71.2 kg)     Height 06/14/17 1202 5\' 5"  (1.651 m)     Head Circumference --      Peak Flow --      Pain Score --      Pain Loc --      Pain Edu? --      Excl. in [place]? --     Vital signs reviewed, nursing assessments reviewed.   Constitutional:   Alert and oriented. Well appearing and in no distress. Eyes:   No scleral  icterus.  EOMI. No nystagmus. No conjunctival pallor. PERRL. ENT   Head:   Normocephalic and atraumatic.   Nose:   No congestion/rhinnorhea.    Mouth/Throat:   MMM, no pharyngeal erythema. No peritonsillar mass.    Neck:   No meningismus. Full ROM. Hematological/Lymphatic/Immunilogical:   No cervical lymphadenopathy. Cardiovascular:   RRR. Symmetric bilateral radial and DP pulses.  No murmurs.  Respiratory:   Normal respiratory effort without tachypnea/retractions. Breath sounds are clear and equal bilaterally. No wheezes/rales/rhonchi. Gastrointestinal:   Soft and nontender. Non distended. There is no CVA tenderness.  No rebound, rigidity, or guarding. Genitourinary:   deferred Musculoskeletal:   Normal range of motion in all extremities. No joint effusions.  No lower extremity tenderness.  No edema. Neurologic:   Normal speech and language.  Motor grossly intact. No acute focal neurologic deficits are appreciated.  Skin:    Skin is warm, dry and intact. No rash noted.  No petechiae, purpura, or bullae.  ____________________________________________    LABS (pertinent positives/negatives) (all labs ordered are listed, but only abnormal results are displayed) Labs Reviewed  BASIC METABOLIC PANEL - Abnormal; Notable for the following components:      Result Value   Potassium 2.8 (*)    Glucose, Bld 140 (*)    All other components within normal limits  CBC WITH DIFFERENTIAL/PLATELET - Abnormal; Notable for the following components:   RBC 5.26 (*)    All other components within normal limits  URINALYSIS, COMPLETE (UACMP) WITH MICROSCOPIC - Abnormal; Notable for the following components:   Color, Urine STRAW (*)    APPearance CLEAR (*)    Specific Gravity, Urine 1.003 (*)    All other components within normal limits  URINE CULTURE  TROPONIN I   ____________________________________________   EKG  interpreted by me Sinus rhythm rate of 71, left axis, normal  intervals. Poor R-wave progression in anterior precordial leads. Normal ST segments and T waves.  ____________________________________________    RADIOLOGY  No results found.  ____________________________________________   PROCEDURES Procedures  ____________________________________________    CLINICAL IMPRESSION / ASSESSMENT AND PLAN / ED COURSE  Pertinent labs & imaging results that were available during my care of the patient were reviewed by me and considered in my medical decision making (see chart for details).   patient well-appearing no acute distress, presents with episode of syncope. No red flag symptoms. I acted baseline and a symptomatic currently. Vital signs are unremarkable. Due to age and history of hypertension and hyperlipidemia, check some screening labs planned for discharge home if data are reassuring. Patient agrees  with this plan.       ----------------------------------------- 2:00 PM on 06/14/2017 -----------------------------------------  Labs normal except for slightly low potassium. We'll give oral replacement in the ED. Counseled to follow up with Dr. for recheck within the next week. Vital signs remained stable and essentially normal. No suspicion of ACS PE dissection AAA stroke or infection. ____________________________________________   FINAL CLINICAL IMPRESSION(S) / ED DIAGNOSES    Final diagnoses:  Syncope, unspecified syncope type  Hypokalemia       Portions of this note were generated with dragon dictation software. Dictation errors may occur despite best attempts at proofreading.    Carrie Mew, MD 06/14/17 1400

## 2017-06-14 NOTE — Discharge Instructions (Signed)
Your lab tests today were unremarkable except showing a low potassium level.  Focus on increasing your potassium intake over the next several days, and follow up with your doctor next week for a recheck.

## 2017-06-14 NOTE — ED Triage Notes (Signed)
Pt to ED via EMS from [place] c/o witnessed syncopal episode.  Denies CP or SOB.  States dizziness when bending over that started before Christmas.  EMS vitals 159/83 supine BP, 70 HR, 200 CBG.  Hx HTN with recent decrease in BP medication.  Presents A&Ox4, speaking in complete and coherent sentences, skin warm and dry.

## 2017-06-15 LAB — URINE CULTURE: Culture: <10000 — AB

## 2017-06-18 ENCOUNTER — Encounter: Payer: Self-pay | Admitting: Family Medicine

## 2017-06-18 ENCOUNTER — Ambulatory Visit (INDEPENDENT_AMBULATORY_CARE_PROVIDER_SITE_OTHER): Payer: Medicare Other | Admitting: Family Medicine

## 2017-06-18 VITALS — Ht 65.0 in | Wt 156.0 lb

## 2017-06-18 DIAGNOSIS — R55 Syncope and collapse: Secondary | ICD-10-CM | POA: Diagnosis not present

## 2017-06-18 DIAGNOSIS — E876 Hypokalemia: Secondary | ICD-10-CM

## 2017-06-18 DIAGNOSIS — I951 Orthostatic hypotension: Secondary | ICD-10-CM | POA: Diagnosis not present

## 2017-06-18 DIAGNOSIS — E860 Dehydration: Secondary | ICD-10-CM | POA: Diagnosis not present

## 2017-06-18 DIAGNOSIS — I10 Essential (primary) hypertension: Secondary | ICD-10-CM

## 2017-06-18 MED ORDER — AMLODIPINE BESYLATE 5 MG PO TABS: 5.0000 mg | ORAL_TABLET | Freq: Every day | ORAL | 1 refills | Status: DC

## 2017-06-18 NOTE — Progress Notes (Signed)
Name: Traci Bowen   MRN: [id number]    DOB: 1940-10-19   Date:06/18/2017       Progress Note  Subjective  Chief Complaint  Chief Complaint  Patient presents with  . Follow-up    ER- low potassium level. Still having dizziness when standing up or lifting head up    Loss of Consciousness  This is a new problem. The current episode started in the past 7 days (friday). The problem has been resolved (secods). She lost consciousness for a period of less than 1 minute. The symptoms are aggravated by standing. Associated symptoms include dizziness and light-headedness. Pertinent negatives include no abdominal pain, auditory change, aura, back pain, bladder incontinence, bowel incontinence, chest pain, clumsiness, confusion, diaphoresis, fever, focal sensory loss, focal weakness, headaches, malaise/fatigue, nausea, palpitations, slurred speech, vertigo, visual change, vomiting or weakness. She has tried nothing for the symptoms. The treatment provided moderate relief.    No problem-specific Assessment & Plan notes found for this encounter.   Past Medical History:  Diagnosis Date  . Hyperlipidemia   . Hypertension   . Neuropathy   . Renal disorder   . Spinal stenosis     Past Surgical History:  Procedure Laterality Date  . CATARACT EXTRACTION Bilateral   . EYE SURGERY Left march 2016   cysts removed   . LITHOTRIPSY    . VAGINAL HYSTERECTOMY      Family History  Problem Relation Age of Onset  . Diabetes Mother   . Hypertension Mother   . Cancer Father   . Cancer Brother     Social History   Socioeconomic History  . Marital status: Widowed    Spouse name: Not on file  . Number of children: Not on file  . Years of education: Not on file  . Highest education level: Not on file  Social Needs  . Financial resource strain: Not on file  . Food insecurity - worry: Not on file  . Food insecurity - inability: Not on file  . Transportation needs - medical: Not on file  .  Transportation needs - non-medical: Not on file  Occupational History  . Not on file  Tobacco Use  . Smoking status: Former Research scientist (life sciences)  . Smokeless tobacco: Never Used  Substance and Sexual Activity  . Alcohol use: No    Alcohol/week: 0.0 oz  . Drug use: No  . Sexual activity: No  Other Topics Concern  . Not on file  Social History Narrative  . Not on file    Allergies  Allergen Reactions  . Iodinated Diagnostic Agents   . Penicillins     Outpatient Medications Prior to Visit  Medication Sig Dispense Refill  . Cholecalciferol (VITAMIN D) 2000 units tablet Take by mouth.    . gabapentin (NEURONTIN) 300 MG capsule Take 1 capsule (300 mg total) by mouth at bedtime. 90 capsule 1  . losartan (COZAAR) 25 MG tablet Take 1 tablet (25 mg total) by mouth daily. 90 tablet 1  . Multiple Vitamins-Minerals (CENTRUM SILVER PO) Take 1 capsule by mouth daily at 6 (six) AM.    . Omega-3 Fatty Acids (FISH OIL) 1000 MG CAPS Take 1 capsule by mouth 2 (two) times daily.    . simvastatin (ZOCOR) 40 MG tablet Take 1 tablet (40 mg total) by mouth at bedtime. 90 tablet 1  . vitamin B-12 (CYANOCOBALAMIN) 1000 MCG tablet Take 1,000 mcg by mouth daily.    Marland Kitchen amLODipine (NORVASC) 10 MG tablet Take 1 tablet (10 mg  total) by mouth daily. 90 tablet 1   No facility-administered medications prior to visit.     Review of Systems  Constitutional: Negative for chills, diaphoresis, fever, malaise/fatigue and weight loss.  HENT: Negative for ear discharge, ear pain and sore throat.   Eyes: Negative for blurred vision.  Respiratory: Negative for cough, sputum production, shortness of breath and wheezing.   Cardiovascular: Positive for syncope. Negative for chest pain, palpitations and leg swelling.  Gastrointestinal: Negative for abdominal pain, blood in stool, bowel incontinence, constipation, diarrhea, heartburn, melena, nausea and vomiting.  Genitourinary: Negative for bladder incontinence, dysuria, frequency,  hematuria and urgency.  Musculoskeletal: Negative for back pain, joint pain, myalgias and neck pain.  Skin: Negative for rash.  Neurological: Positive for dizziness and light-headedness. Negative for vertigo, tingling, sensory change, focal weakness, weakness and headaches.  Endo/Heme/Allergies: Negative for environmental allergies and polydipsia. Does not bruise/bleed easily.  Psychiatric/Behavioral: Negative for confusion, depression and suicidal ideas. The patient is not nervous/anxious and does not have insomnia.      Objective  Vitals:   06/18/17 0836  Weight: 156 lb (70.8 kg)  Height: 5\' 5"  (1.651 m)    Physical Exam  Constitutional: She is well-developed, well-nourished, and in no distress. No distress.  HENT:  Head: Normocephalic and atraumatic.  Right Ear: Tympanic membrane and external ear normal.  Left Ear: Tympanic membrane and external ear normal.  Nose: Nose normal.  Mouth/Throat: Oropharynx is clear and moist. Mucous membranes are dry. No oropharyngeal exudate, posterior oropharyngeal edema or posterior oropharyngeal erythema.  Eyes: Conjunctivae and EOM are normal. Pupils are equal, round, and reactive to light. Right eye exhibits no discharge. Left eye exhibits no discharge.  Neck: Normal range of motion. Neck supple. No JVD present. No thyromegaly present.  Cardiovascular: Normal rate, regular rhythm, normal heart sounds and intact distal pulses. Exam reveals no gallop and no friction rub.  No murmur heard. Pulmonary/Chest: Effort normal and breath sounds normal.  Abdominal: Soft. Bowel sounds are normal. She exhibits no mass. There is no tenderness. There is no guarding.  Musculoskeletal: Normal range of motion. She exhibits no edema.  Lymphadenopathy:    She has no cervical adenopathy.  Neurological: She is alert. She has normal reflexes.  Skin: Skin is warm and dry. She is not diaphoretic.  Psychiatric: Mood and affect normal.  Nursing note and vitals  reviewed.     Assessment & Plan  Problem List Items Addressed This Visit      Cardiovascular and Mediastinum   Essential hypertension   Relevant Medications   amLODipine (NORVASC) 5 MG tablet    Other Visit Diagnoses    Near syncope    -  Primary   Relevant Medications   amLODipine (NORVASC) 5 MG tablet   Orthostatic hypotension       Relevant Medications   amLODipine (NORVASC) 5 MG tablet   Hypokalemia       Relevant Orders   Potassium   Dehydration          Meds ordered this encounter  Medications  . amLODipine (NORVASC) 5 MG tablet    Sig: Take 1 tablet (5 mg total) by mouth daily.    Dispense:  30 tablet    Refill:  1      Dr. Macon [facility]  06/18/17

## 2017-06-19 LAB — POTASSIUM: Potassium: 4.1 mmol/L (ref 3.5–5.2)

## 2017-07-11 ENCOUNTER — Telehealth: Payer: Self-pay

## 2017-07-11 NOTE — Telephone Encounter (Signed)
Called pt to sched for AWV w/ [facility]. Requested to schedule appt in March. States she is getting married in the next 2 weeks. Appt has been sched for 08/05/17 @ 8:15am.

## 2017-07-16 ENCOUNTER — Encounter: Payer: Self-pay | Admitting: Family Medicine

## 2017-07-16 ENCOUNTER — Ambulatory Visit (INDEPENDENT_AMBULATORY_CARE_PROVIDER_SITE_OTHER): Payer: Medicare Other | Admitting: Family Medicine

## 2017-07-16 VITALS — BP 130/82 | HR 72 | Ht 65.0 in | Wt 160.0 lb

## 2017-07-16 DIAGNOSIS — I10 Essential (primary) hypertension: Secondary | ICD-10-CM

## 2017-07-16 DIAGNOSIS — J0100 Acute maxillary sinusitis, unspecified: Secondary | ICD-10-CM

## 2017-07-16 DIAGNOSIS — M7541 Impingement syndrome of right shoulder: Secondary | ICD-10-CM

## 2017-07-16 MED ORDER — AMLODIPINE BESYLATE 5 MG PO TABS: 5.0000 mg | ORAL_TABLET | Freq: Every day | ORAL | 1 refills | Status: DC

## 2017-07-16 MED ORDER — AMOXICILLIN 500 MG PO CAPS: 500.0000 mg | ORAL_CAPSULE | Freq: Three times a day (TID) | ORAL | 0 refills | Status: DC

## 2017-07-16 MED ORDER — LOSARTAN POTASSIUM 25 MG PO TABS: 25.0000 mg | ORAL_TABLET | Freq: Every day | ORAL | 1 refills | Status: DC

## 2017-07-16 MED ORDER — FLUCONAZOLE 150 MG PO TABS: 150.0000 mg | ORAL_TABLET | Freq: Once | ORAL | 0 refills | Status: AC

## 2017-07-16 MED ORDER — MELOXICAM 7.5 MG PO TABS: 7.5000 mg | ORAL_TABLET | Freq: Every day | ORAL | 0 refills | Status: DC

## 2017-07-16 MED ORDER — BENZONATATE 100 MG PO CAPS: 100.0000 mg | ORAL_CAPSULE | Freq: Two times a day (BID) | ORAL | 0 refills | Status: DC | PRN

## 2017-07-16 NOTE — Progress Notes (Signed)
Name: Traci Bowen   MRN: [id number]    DOB: [birth year]   Date:07/16/2017       Progress Note  Subjective  Chief Complaint  Chief Complaint  Patient presents with  . Follow-up    decreased B/P due to dizziness when standing up    Hypertension  This is a chronic problem. The current episode started more than 1 year ago. The problem has been gradually worsening since onset. The problem is controlled. Pertinent negatives include no anxiety, blurred vision, chest pain, headaches, malaise/fatigue, neck pain, orthopnea, palpitations, peripheral edema, PND, shortness of breath or sweats. There are no associated agents to hypertension. Past treatments include calcium channel blockers and angiotensin blockers. There are no compliance problems.  There is no history of angina, kidney disease, CAD/MI, CVA, heart failure, PVD or retinopathy. There is no history of chronic renal disease, a hypertension causing med or renovascular disease.  Shoulder Pain   The pain is present in the right shoulder. This is a recurrent problem. The current episode started 1 to 4 weeks ago. The problem occurs intermittently. The problem has been gradually worsening. The quality of the pain is described as aching. The pain is at a severity of 4/10. Pertinent negatives include no fever or tingling. The symptoms are aggravated by activity. She has tried nothing for the symptoms. The treatment provided moderate relief.  Sinusitis  This is a new problem. The current episode started in the past 7 days. The problem has been gradually worsening since onset. There has been no fever. Associated symptoms include congestion, coughing and sinus pressure. Pertinent negatives include no chills, diaphoresis, ear pain, headaches, hoarse voice, neck pain, shortness of breath, sneezing or sore throat. (Prod green/yellow sinus discharge) Past treatments include nothing.    No problem-specific Assessment & Plan notes found for this  encounter.   Past Medical History:  Diagnosis Date  . Hyperlipidemia   . Hypertension   . Neuropathy   . Renal disorder   . Spinal stenosis     Past Surgical History:  Procedure Laterality Date  . CATARACT EXTRACTION Bilateral   . EYE SURGERY Left march 2016   cysts removed   . LITHOTRIPSY    . VAGINAL HYSTERECTOMY      Family History  Problem Relation Age of Onset  . Diabetes Mother   . Hypertension Mother   . Cancer Father   . Cancer Brother     Social History   Socioeconomic History  . Marital status: Widowed    Spouse name: Not on file  . Number of children: Not on file  . Years of education: Not on file  . Highest education level: Not on file  Social Needs  . Financial resource strain: Not on file  . Food insecurity - worry: Not on file  . Food insecurity - inability: Not on file  . Transportation needs - medical: Not on file  . Transportation needs - non-medical: Not on file  Occupational History  . Not on file  Tobacco Use  . Smoking status: Former Research scientist (life sciences)  . Smokeless tobacco: Never Used  Substance and Sexual Activity  . Alcohol use: No    Alcohol/week: 0.0 oz  . Drug use: No  . Sexual activity: No  Other Topics Concern  . Not on file  Social History Narrative  . Not on file    Allergies  Allergen Reactions  . Iodinated Diagnostic Agents   . Penicillins     Outpatient Medications Prior to Visit  Medication Sig Dispense Refill  . Cholecalciferol (VITAMIN D) 2000 units tablet Take by mouth.    . gabapentin (NEURONTIN) 300 MG capsule Take 1 capsule (300 mg total) by mouth at bedtime. 90 capsule 1  . Multiple Vitamins-Minerals (CENTRUM SILVER PO) Take 1 capsule by mouth daily at 6 (six) AM.    . Omega-3 Fatty Acids (FISH OIL) 1000 MG CAPS Take 1 capsule by mouth 2 (two) times daily.    . simvastatin (ZOCOR) 40 MG tablet Take 1 tablet (40 mg total) by mouth at bedtime. 90 tablet 1  . vitamin B-12 (CYANOCOBALAMIN) 1000 MCG tablet Take 1,000 mcg  by mouth daily.    Marland Kitchen amLODipine (NORVASC) 5 MG tablet Take 1 tablet (5 mg total) by mouth daily. 30 tablet 1  . losartan (COZAAR) 25 MG tablet Take 1 tablet (25 mg total) by mouth daily. 90 tablet 1   No facility-administered medications prior to visit.     Review of Systems  Constitutional: Negative for chills, diaphoresis, fever, malaise/fatigue and weight loss.  HENT: Positive for congestion and sinus pressure. Negative for ear discharge, ear pain, hoarse voice, sneezing and sore throat.   Eyes: Negative for blurred vision.  Respiratory: Positive for cough. Negative for sputum production, shortness of breath and wheezing.   Cardiovascular: Negative for chest pain, palpitations, orthopnea, leg swelling and PND.  Gastrointestinal: Negative for abdominal pain, blood in stool, constipation, diarrhea, heartburn, melena and nausea.  Genitourinary: Negative for dysuria, frequency, hematuria and urgency.  Musculoskeletal: Negative for back pain, joint pain, myalgias and neck pain.  Skin: Negative for rash.  Neurological: Negative for dizziness, tingling, sensory change, focal weakness and headaches.  Endo/Heme/Allergies: Negative for environmental allergies and polydipsia. Does not bruise/bleed easily.  Psychiatric/Behavioral: Negative for depression and suicidal ideas. The patient is not nervous/anxious and does not have insomnia.      Objective  Vitals:   07/16/17 0849  BP: 130/82  Pulse: 72  Weight: 160 lb (72.6 kg)  Height: 5\' 5"  (1.651 m)    Physical Exam  Constitutional: She is well-developed, well-nourished, and in no distress. No distress.  HENT:  Head: Normocephalic and atraumatic.  Right Ear: Tympanic membrane and external ear normal.  Left Ear: Tympanic membrane and external ear normal.  Nose: Mucosal edema present. Right sinus exhibits maxillary sinus tenderness. Left sinus exhibits maxillary sinus tenderness.  Mouth/Throat: Oropharynx is clear and moist. No  oropharyngeal exudate, posterior oropharyngeal edema or posterior oropharyngeal erythema.  Eyes: Conjunctivae and EOM are normal. Pupils are equal, round, and reactive to light. Right eye exhibits no discharge. Left eye exhibits no discharge.  Neck: Normal range of motion. Neck supple. No JVD present. No thyromegaly present.  Cardiovascular: Normal rate, regular rhythm, normal heart sounds and intact distal pulses. Exam reveals no gallop and no friction rub.  No murmur heard. Pulmonary/Chest: Effort normal and breath sounds normal. She has no wheezes. She has no rales.  Abdominal: Soft. Bowel sounds are normal. She exhibits no mass. There is no tenderness. There is no guarding.  Musculoskeletal: Normal range of motion. She exhibits no edema.  Lymphadenopathy:    She has no cervical adenopathy.  Neurological: She is alert. She has normal reflexes.  Skin: Skin is warm and dry. She is not diaphoretic.  Psychiatric: Mood and affect normal.  Vitals reviewed.     Assessment & Plan  Problem List Items Addressed This Visit      Cardiovascular and Mediastinum   Essential hypertension - Primary   Relevant  Medications   losartan (COZAAR) 25 MG tablet   amLODipine (NORVASC) 5 MG tablet    Other Visit Diagnoses    Acute non-recurrent maxillary sinusitis       pt can take amoxil   Relevant Medications   amoxicillin (AMOXIL) 500 MG capsule   fluconazole (DIFLUCAN) 150 MG tablet   benzonatate (TESSALON) 100 MG capsule   Impingement syndrome of right shoulder       Relevant Medications   meloxicam (MOBIC) 7.5 MG tablet      Meds ordered this encounter  Medications  . losartan (COZAAR) 25 MG tablet    Sig: Take 1 tablet (25 mg total) by mouth daily.    Dispense:  90 tablet    Refill:  1  . amLODipine (NORVASC) 5 MG tablet    Sig: Take 1 tablet (5 mg total) by mouth daily.    Dispense:  30 tablet    Refill:  1  . meloxicam (MOBIC) 7.5 MG tablet    Sig: Take 1 tablet (7.5 mg total) by  mouth daily.    Dispense:  30 tablet    Refill:  0  . amoxicillin (AMOXIL) 500 MG capsule    Sig: Take 1 capsule (500 mg total) by mouth 3 (three) times daily.    Dispense:  30 capsule    Refill:  0  . fluconazole (DIFLUCAN) 150 MG tablet    Sig: Take 1 tablet (150 mg total) by mouth once for 1 dose.    Dispense:  1 tablet    Refill:  0  . benzonatate (TESSALON) 100 MG capsule    Sig: Take 1 capsule (100 mg total) by mouth 2 (two) times daily as needed for cough.    Dispense:  20 capsule    Refill:  0      Dr. Otilio Miu [facility]  07/16/17

## 2017-08-05 ENCOUNTER — Ambulatory Visit: Payer: Self-pay

## 2017-08-15 ENCOUNTER — Ambulatory Visit (INDEPENDENT_AMBULATORY_CARE_PROVIDER_SITE_OTHER): Payer: Medicare Other

## 2017-08-15 VITALS — BP 128/64 | HR 68 | Temp 98.0°F | Resp 12 | Ht 65.0 in | Wt 162.0 lb

## 2017-08-15 DIAGNOSIS — Z23 Encounter for immunization: Secondary | ICD-10-CM | POA: Diagnosis not present

## 2017-08-15 DIAGNOSIS — Z0000 Encounter for general adult medical examination without abnormal findings: Secondary | ICD-10-CM

## 2017-08-15 NOTE — Progress Notes (Signed)
Subjective:   Traci Bowen is a 77 y.o. female who presents for Medicare Annual (Subsequent) preventive examination.  Review of Systems:  N/A Cardiac Risk Factors include: advanced age (>37men, >61 women);dyslipidemia;hypertension;sedentary lifestyle     Objective:     Vitals: BP 128/64 (BP Location: Right Arm, Patient Position: Sitting, Cuff Size: Normal)   Pulse 68   Temp 98 F (36.7 C) (Oral)   Resp 12   Ht 5\' 5"  (1.651 m)   Wt 162 lb (73.5 kg)   SpO2 92%   BMI 26.96 kg/m   Body mass index is 26.96 kg/m.  Advanced Directives 08/15/2017 06/14/2017 02/10/2015  Does Patient Have a Medical Advance Directive? Yes Yes No;Yes  Type of Paramedic of [place];Living will Healthcare Power of Attorney Living will  Copy of [place] in Chart? No - copy requested No - copy requested -  Would patient like information on creating a medical advance directive? - - No - patient declined information    Tobacco Social History   Tobacco Use  Smoking Status Former Smoker  . Packs/day: 0.50  . Years: 3.00  . Pack years: 1.50  . Types: Cigarettes  . Last attempt to quit: 1989  . Years since quitting: 30.2  Smokeless Tobacco Never Used  Tobacco Comment   smoking cessation materials not required     Counseling given: No Comment: smoking cessation materials not required   Clinical Intake:  Pre-visit preparation completed: Yes  Pain : No/denies pain   BMI - recorded: 26.96 Nutritional Status: BMI 25 -29 Overweight Nutritional Risks: None Diabetes: No  How often do you need to have someone help you when you read instructions, pamphlets, or other written materials from your doctor or pharmacy?: 1 - Never  Interpreter Needed?: No  Information entered by :: AEversole, LPN  Past Medical History:  Diagnosis Date  . Hyperlipidemia   . Hypertension   . Neuropathy   . Renal disorder   . Spinal stenosis    Past Surgical History:    Procedure Laterality Date  . CATARACT EXTRACTION Bilateral   . EYE SURGERY Left march 2016   cysts removed   . LITHOTRIPSY    . VAGINAL HYSTERECTOMY     Family History  Problem Relation Age of Onset  . Diabetes Mother   . Hypertension Mother   . Cancer Father   . Cancer Brother   . Stroke Sister   . Heart attack Brother    Social History   Socioeconomic History  . Marital status: Married    Spouse name: Not on file  . Number of children: 2  . Years of education: 1 year college  . Highest education level: 12th grade  Occupational History  . Occupation: Retired  Scientific laboratory technician  . Financial resource strain: Not hard at all  . Food insecurity:    Worry: Never true    Inability: Never true  . Transportation needs:    Medical: No    Non-medical: No  Tobacco Use  . Smoking status: Former Smoker    Packs/day: 0.50    Years: 3.00    Pack years: 1.50    Types: Cigarettes    Last attempt to quit: 1989    Years since quitting: 30.2  . Smokeless tobacco: Never Used  . Tobacco comment: smoking cessation materials not required  Substance and Sexual Activity  . Alcohol use: No    Alcohol/week: 0.0 oz  . Drug use: No  .  Sexual activity: Never  Lifestyle  . Physical activity:    Days per week: 0 days    Minutes per session: 0 min  . Stress: Not at all  Relationships  . Social connections:    Talks on phone: Patient refused    Gets together: Patient refused    Attends religious service: Patient refused    Active member of club or organization: Patient refused    Attends meetings of clubs or organizations: Patient refused    Relationship status: Married  Other Topics Concern  . Not on file  Social History Narrative  . Not on file    Outpatient Encounter Medications as of 08/15/2017  Medication Sig  . amLODipine (NORVASC) 5 MG tablet Take 1 tablet (5 mg total) by mouth daily.  . Cholecalciferol (VITAMIN D) 2000 units tablet Take by mouth.  . gabapentin (NEURONTIN)  300 MG capsule Take 1 capsule (300 mg total) by mouth at bedtime.  Marland Kitchen losartan (COZAAR) 25 MG tablet Take 1 tablet (25 mg total) by mouth daily.  . Multiple Vitamins-Minerals (CENTRUM SILVER PO) Take 1 capsule by mouth daily at 6 (six) AM.  . Omega-3 Fatty Acids (FISH OIL) 1000 MG CAPS Take 1 capsule by mouth 2 (two) times daily.  . simvastatin (ZOCOR) 40 MG tablet Take 1 tablet (40 mg total) by mouth at bedtime.  . vitamin B-12 (CYANOCOBALAMIN) 1000 MCG tablet Take 1,000 mcg by mouth daily.  . [DISCONTINUED] amoxicillin (AMOXIL) 500 MG capsule Take 1 capsule (500 mg total) by mouth 3 (three) times daily.  . [DISCONTINUED] benzonatate (TESSALON) 100 MG capsule Take 1 capsule (100 mg total) by mouth 2 (two) times daily as needed for cough.  . [DISCONTINUED] meloxicam (MOBIC) 7.5 MG tablet Take 1 tablet (7.5 mg total) by mouth daily.   No facility-administered encounter medications on file as of 08/15/2017.     Activities of Daily Living In your present state of health, do you have any difficulty performing the following activities: 08/15/2017  Hearing? N  Comment B hearing aids  Vision? N  Comment wears reading glasses  Difficulty concentrating or making decisions? N  Walking or climbing stairs? N  Dressing or bathing? N  Doing errands, shopping? N  Preparing Food and eating ? N  Comment upper implants  Using the Toilet? N  In the past six months, have you accidently leaked urine? N  Do you have problems with loss of bowel control? N  Managing your Medications? N  Managing your Finances? N  Housekeeping or managing your Housekeeping? N  Some recent data might be hidden    Patient Care Team: Juline Patch, MD as PCP - General (Family Medicine)    Assessment:   This is a routine wellness examination for [place].  Exercise Activities and Dietary recommendations Current Exercise Habits: The patient does not participate in regular exercise at present, Exercise limited by: None  identified  Goals    . DIET - INCREASE WATER INTAKE     Recommend to drink at least 6-8 8oz glasses of water per day.       Fall Risk Fall Risk  08/15/2017 02/13/2017 08/31/2015 02/10/2015  Falls in the past year? Yes No No No  Comment syncope, b/p meds changed - - -  Number falls in past yr: 1 - - -  Injury with Fall? No - - -  Risk for fall due to : Medication side effect - - -  Follow up Falls evaluation completed;Education provided;Falls prevention discussed - - -  Is the patient's home free of loose throw rugs in walkways, pet beds, electrical cords, etc?   Yes Does the patient have any grab bars in the bathroom? Yes  Does the patient use a shower chair when bathing? Yes Does the patient have any stairs in or around the home? No If so, are there any handrails?  N/A Does the patient have adequate lighting?  Yes Does the patient use a cane, walker or w/c? No Does the patient use of an elevated toilet seat? No  Timed Get Up and Go Performed: Yes. Pt ambulated 10 feet within 10 sec. Gait stead-fast and without the use of an assistive device. No intervention required at this time. Fall risk prevention has been discussed.  Pt declined my offer to send Community Resource Referral to Care Guide for an elevated toilet seat.  Depression Screen PHQ 2/9 Scores 08/15/2017 02/13/2017 02/13/2017 08/31/2015  PHQ - 2 Score 0 0 0 0  PHQ- 9 Score 0 0 - -     Cognitive Function     6CIT Screen 08/15/2017  What Year? 0 points  What month? 0 points  What time? 0 points  Count back from 20 0 points  Months in reverse 0 points  Repeat phrase 2 points  Total Score 2    Immunization History  Administered Date(s) Administered  . Influenza,inj,Quad PF,6+ Mos 02/13/2017  . Influenza-Unspecified 03/16/2016  . Pneumococcal Conjugate-13 04/30/2016  . Pneumococcal Polysaccharide-23 08/15/2017  . Tdap 04/30/2016    Qualifies for Shingles Vaccine? Yes. Due for Zostavax or Shingrix vaccine.  Education has been provided regarding the importance of this vaccine. Pt has been advised to call her insurance company to determine her out of pocket expense. Advised she may also receive this vaccine at her local pharmacy or Health Dept. Verbalized acceptance and understanding.  Screening Tests Health Maintenance  Topic Date Due  . TETANUS/TDAP  04/30/2026  . INFLUENZA VACCINE  Completed  . DEXA SCAN  Completed  . PNA vac Low Risk Adult  Completed    Cancer Screenings: Lung: Low Dose CT Chest recommended if Age 18-80 years, 30 pack-year currently smoking OR have quit w/in 15years. Patient does not qualify. Breast Screening: No longer required Bone Density Screening: No longer required Colorectal Screenings: No longer required  Additional Screenings: Hepatitis B/HIV/Syphillis: Does not qualify Hepatitis C Screening: Does not qualify     Plan:  I have personally reviewed and addressed the Medicare Annual Wellness questionnaire and have noted the following in the patient's chart:  A. Medical and social history B. Use of alcohol, tobacco or illicit drugs  C. Current medications and supplements D. Functional ability and status E.  Nutritional status F.  Physical activity G. Advance directives H. List of other physicians I.  Hospitalizations, surgeries, and ER visits in previous 12 months J.  [place] such as hearing and vision if needed, cognitive and depression L. Referrals and appointments - none  In addition, I have reviewed and discussed with patient certain preventive protocols, quality metrics, and best practice recommendations. A written personalized care plan for preventive services as well as general preventive health recommendations were provided to patient.  Signed,  Aleatha Borer, LPN Nurse Health Advisor  MD Recommendations: Due for Zostavax or Shingrix vaccine. Education has been provided regarding the importance of this vaccine. Pt has been advised  to call her insurance company to determine her out of pocket expense. Advised she may also receive this vaccine at her local pharmacy or  Health Dept. Verbalized acceptance and understanding.

## 2017-08-15 NOTE — Patient Instructions (Signed)
Traci Bowen , Thank you for taking time to come for your Medicare Wellness Visit. I appreciate your ongoing commitment to your health goals. Please review the following plan we discussed and let me know if I can assist you in the future.   Screening recommendations/referrals: Colorectal Screening: No longer required Mammogram: No longer required Bone Density: No longer required  Vision/Dental Exams: Recommended yearly ophthalmology/optometry visit for glaucoma screening and checkup Recommended yearly dental visit for hygiene and checkup  Vaccinations: Influenza vaccine: Up to date Pneumococcal vaccine: Completed series today Tdap vaccine: Up to date Shingles vaccine: Please call your insurance company to determine your out of pocket expense for the Shingrix vaccine. You may also receive this vaccine at your local pharmacy or Health Dept.   Advanced directives: Please bring a copy of your POA (Power of Attorney) and/or Living Will to your next appointment.  Conditions/risks identified: Recommend to drink at least 6-8 8oz glasses of water per day.  Next appointment: Please schedule your Annual Wellness Visit with your Nurse Health Advisor in one year.  Preventive Care 14 Years and Older, Female Preventive care refers to lifestyle choices and visits with your health care provider that can promote health and wellness. What does preventive care include?  A yearly physical exam. This is also called an annual well check.  Dental exams once or twice a year.  Routine eye exams. Ask your health care provider how often you should have your eyes checked.  Personal lifestyle choices, including:  Daily care of your teeth and gums.  Regular physical activity.  Eating a healthy diet.  Avoiding tobacco and drug use.  Limiting alcohol use.  Practicing safe sex.  Taking low-dose aspirin every day.  Taking vitamin and mineral supplements as recommended by your health care provider. What  happens during an annual well check? The services and screenings done by your health care provider during your annual well check will depend on your age, overall health, lifestyle risk factors, and family history of disease. Counseling  Your health care provider may ask you questions about your:  Alcohol use.  Tobacco use.  Drug use.  Emotional well-being.  Home and relationship well-being.  Sexual activity.  Eating habits.  History of falls.  Memory and ability to understand (cognition).  Work and work Statistician.  Reproductive health. Screening  You may have the following tests or measurements:  Height, weight, and BMI.  Blood pressure.  Lipid and cholesterol levels. These may be checked every 5 years, or more frequently if you are over 74 years old.  Skin check.  Lung cancer screening. You may have this screening every year starting at age 66 if you have a 30-pack-year history of smoking and currently smoke or have quit within the past 15 years.  Fecal occult blood test (FOBT) of the stool. You may have this test every year starting at age 60.  Flexible sigmoidoscopy or colonoscopy. You may have a sigmoidoscopy every 5 years or a colonoscopy every 10 years starting at age 33.  Hepatitis C blood test.  Hepatitis B blood test.  Sexually transmitted disease (STD) testing.  Diabetes screening. This is done by checking your blood sugar (glucose) after you have not eaten for a while (fasting). You may have this done every 1-3 years.  Bone density scan. This is done to screen for osteoporosis. You may have this done starting at age 69.  Mammogram. This may be done every 1-2 years. Talk to your health care provider about  how often you should have regular mammograms. Talk with your health care provider about your test results, treatment options, and if necessary, the need for more tests. Vaccines  Your health care provider may recommend certain vaccines, such  as:  Influenza vaccine. This is recommended every year.  Tetanus, diphtheria, and acellular pertussis (Tdap, Td) vaccine. You may need a Td booster every 10 years.  Zoster vaccine. You may need this after age 48.  Pneumococcal 13-valent conjugate (PCV13) vaccine. One dose is recommended after age 48.  Pneumococcal polysaccharide (PPSV23) vaccine. One dose is recommended after age 33. Talk to your health care provider about which screenings and vaccines you need and how often you need them. This information is not intended to replace advice given to you by your health care provider. Make sure you discuss any questions you have with your health care provider. Document Released: 06/10/2015 Document Revised: 02/01/2016 Document Reviewed: 03/15/2015 Elsevier Interactive Patient Education  2017 [place] Prevention in the Home Falls can cause injuries. They can happen to people of all ages. There are many things you can do to make your home safe and to help prevent falls. What can I do on the outside of my home?  Regularly fix the edges of walkways and driveways and fix any cracks.  Remove anything that might make you trip as you walk through a door, such as a raised step or threshold.  Trim any bushes or trees on the path to your home.  Use bright outdoor lighting.  Clear any walking paths of anything that might make someone trip, such as rocks or tools.  Regularly check to see if handrails are loose or broken. Make sure that both sides of any steps have handrails.  Any raised decks and porches should have guardrails on the edges.  Have any leaves, snow, or ice cleared regularly.  Use sand or salt on walking paths during winter.  Clean up any spills in your garage right away. This includes oil or grease spills. What can I do in the bathroom?  Use night lights.  Install grab bars by the toilet and in the tub and shower. Do not use towel bars as grab bars.  Use  non-skid mats or decals in the tub or shower.  If you need to sit down in the shower, use a plastic, non-slip stool.  Keep the floor dry. Clean up any water that spills on the floor as soon as it happens.  Remove soap buildup in the tub or shower regularly.  Attach bath mats securely with double-sided non-slip rug tape.  Do not have throw rugs and other things on the floor that can make you trip. What can I do in the bedroom?  Use night lights.  Make sure that you have a light by your bed that is easy to reach.  Do not use any sheets or blankets that are too big for your bed. They should not hang down onto the floor.  Have a firm chair that has side arms. You can use this for support while you get dressed.  Do not have throw rugs and other things on the floor that can make you trip. What can I do in the kitchen?  Clean up any spills right away.  Avoid walking on wet floors.  Keep items that you use a lot in easy-to-reach places.  If you need to reach something above you, use a strong step stool that has a grab bar.  Keep  electrical cords out of the way.  Do not use floor polish or wax that makes floors slippery. If you must use wax, use non-skid floor wax.  Do not have throw rugs and other things on the floor that can make you trip. What can I do with my stairs?  Do not leave any items on the stairs.  Make sure that there are handrails on both sides of the stairs and use them. Fix handrails that are broken or loose. Make sure that handrails are as long as the stairways.  Check any carpeting to make sure that it is firmly attached to the stairs. Fix any carpet that is loose or worn.  Avoid having throw rugs at the top or bottom of the stairs. If you do have throw rugs, attach them to the floor with carpet tape.  Make sure that you have a light switch at the top of the stairs and the bottom of the stairs. If you do not have them, ask someone to add them for you. What  else can I do to help prevent falls?  Wear shoes that:  Do not have high heels.  Have rubber bottoms.  Are comfortable and fit you well.  Are closed at the toe. Do not wear sandals.  If you use a stepladder:  Make sure that it is fully opened. Do not climb a closed stepladder.  Make sure that both sides of the stepladder are locked into place.  Ask someone to hold it for you, if possible.  Clearly mark and make sure that you can see:  Any grab bars or handrails.  First and last steps.  Where the edge of each step is.  Use tools that help you move around (mobility aids) if they are needed. These include:  Canes.  Walkers.  Scooters.  Crutches.  Turn on the lights when you go into a dark area. Replace any light bulbs as soon as they burn out.  Set up your furniture so you have a clear path. Avoid moving your furniture around.  If any of your floors are uneven, fix them.  If there are any pets around you, be aware of where they are.  Review your medicines with your doctor. Some medicines can make you feel dizzy. This can increase your chance of falling. Ask your doctor what other things that you can do to help prevent falls. This information is not intended to replace advice given to you by your health care provider. Make sure you discuss any questions you have with your health care provider. Document Released: 03/10/2009 Document Revised: 10/20/2015 Document Reviewed: 06/18/2014 Elsevier Interactive Patient Education  2017 [employer].

## 2017-08-29 ENCOUNTER — Other Ambulatory Visit: Payer: Self-pay | Admitting: Family Medicine

## 2017-08-29 DIAGNOSIS — I10 Essential (primary) hypertension: Secondary | ICD-10-CM

## 2017-10-03 ENCOUNTER — Other Ambulatory Visit: Payer: Self-pay | Admitting: Family Medicine

## 2017-10-03 DIAGNOSIS — M48061 Spinal stenosis, lumbar region without neurogenic claudication: Secondary | ICD-10-CM

## 2017-11-11 ENCOUNTER — Other Ambulatory Visit: Payer: Self-pay | Admitting: Family Medicine

## 2017-11-11 DIAGNOSIS — I10 Essential (primary) hypertension: Secondary | ICD-10-CM

## 2017-12-02 ENCOUNTER — Other Ambulatory Visit: Payer: Self-pay | Admitting: Family Medicine

## 2017-12-02 DIAGNOSIS — I10 Essential (primary) hypertension: Secondary | ICD-10-CM

## 2017-12-02 DIAGNOSIS — E782 Mixed hyperlipidemia: Secondary | ICD-10-CM

## 2017-12-10 DIAGNOSIS — H43393 Other vitreous opacities, bilateral: Secondary | ICD-10-CM | POA: Diagnosis not present

## 2017-12-10 DIAGNOSIS — G43819 Other migraine, intractable, without status migrainosus: Secondary | ICD-10-CM | POA: Diagnosis not present

## 2017-12-30 ENCOUNTER — Encounter: Payer: Self-pay | Admitting: Family Medicine

## 2017-12-30 ENCOUNTER — Other Ambulatory Visit: Payer: Self-pay | Admitting: Family Medicine

## 2017-12-30 ENCOUNTER — Ambulatory Visit (INDEPENDENT_AMBULATORY_CARE_PROVIDER_SITE_OTHER): Payer: Medicare Other | Admitting: Family Medicine

## 2017-12-30 VITALS — BP 132/82 | HR 66 | Resp 16 | Ht 65.0 in | Wt 164.0 lb

## 2017-12-30 DIAGNOSIS — I10 Essential (primary) hypertension: Secondary | ICD-10-CM | POA: Diagnosis not present

## 2017-12-30 DIAGNOSIS — M7591 Shoulder lesion, unspecified, right shoulder: Secondary | ICD-10-CM | POA: Diagnosis not present

## 2017-12-30 DIAGNOSIS — M7071 Other bursitis of hip, right hip: Secondary | ICD-10-CM

## 2017-12-30 DIAGNOSIS — M48061 Spinal stenosis, lumbar region without neurogenic claudication: Secondary | ICD-10-CM

## 2017-12-30 MED ORDER — AMLODIPINE BESYLATE 5 MG PO TABS: 5.0000 mg | ORAL_TABLET | Freq: Every day | ORAL | 0 refills | Status: DC

## 2017-12-30 MED ORDER — MELOXICAM 7.5 MG PO TABS: 7.5000 mg | ORAL_TABLET | Freq: Every day | ORAL | 0 refills | Status: DC

## 2017-12-30 NOTE — Assessment & Plan Note (Signed)
Chronic Stable Will refer amlodipine 5 mg daily. Recheck 3 mos.

## 2017-12-30 NOTE — Patient Instructions (Signed)
Shoulder Impingement Syndrome Shoulder impingement syndrome is a condition that causes pain when connective tissues (tendons) surrounding the shoulder joint become pinched. These tendons are part of the group of muscles and tissues that help to stabilize the shoulder (rotator cuff). Beneath the rotator cuff is a fluid-filled sac (bursa) that allows the muscles and tendons to glide smoothly. The bursa may become swollen or irritated (bursitis). Bursitis, swelling in the rotator cuff tendons, or both conditions can decrease how much space is under a bone in the shoulder joint (acromion), resulting in impingement. What are the causes? Shoulder impingement syndrome can be caused by bursitis or swelling of the rotator cuff tendons, which may result from:  Repetitive overhead arm movements.  Falling onto the shoulder.  Weakness in the shoulder muscles.  What increases the risk? You may be more likely to develop this condition if you are an athlete who participates in:  Sports that involve throwing, such as baseball.  Tennis.  Swimming.  Volleyball.  Some people are also more likely to develop impingement syndrome because of the shape of their acromion bone. What are the signs or symptoms? The main symptom of this condition is pain on the front or side of the shoulder. Pain may:  Get worse when lifting or raising the arm.  Get worse at night.  Wake you up from sleeping.  Feel sharp when the shoulder is moved, and then fade to an ache.  Other signs and symptoms may include:  Tenderness.  Stiffness.  Inability to raise the arm above shoulder level or behind the body.  Weakness.  How is this diagnosed? This condition may be diagnosed based on:  Your symptoms.  Your medical history.  A physical exam.  Imaging tests, such as: ? X-rays. ? MRI. ? Ultrasound.  How is this treated? Treatment for this condition may include:  Resting your shoulder and avoiding all  activities that cause pain or put stress on the shoulder.  Icing your shoulder.  NSAIDs to help reduce pain and swelling.  One or more injections of medicines to numb the area and reduce inflammation.  Physical therapy.  Surgery. This may be needed if nonsurgical treatments have not helped. Surgery may involve repairing the rotator cuff, reshaping the acromion, or removing the bursa.  Follow these instructions at home: Managing pain, stiffness, and swelling  If directed, apply ice to the injured area. ? Put ice in a plastic bag. ? Place a towel between your skin and the bag. ? Leave the ice on for 20 minutes, 2-3 times a day. Activity  Rest and return to your normal activities as told by your health care provider. Ask your health care provider what activities are safe for you.  Do exercises as told by your health care provider. General instructions  Do not use any tobacco products, including cigarettes, chewing tobacco, or e-cigarettes. Tobacco can delay healing. If you need help quitting, ask your health care provider.  Ask your health care provider when it is safe for you to drive.  Take over-the-counter and prescription medicines only as told by your health care provider.  Keep all follow-up visits as told by your health care provider. This is important. How is this prevented?  Give your body time to rest between periods of activity.  Be safe and responsible while being active to avoid falls.  Maintain physical fitness, including strength and flexibility. Contact a health care provider if:  Your symptoms have not improved after 1-2 months of treatment and   rest.  You cannot lift your arm away from your body. This information is not intended to replace advice given to you by your health care provider. Make sure you discuss any questions you have with your health care provider. Document Released: 05/14/2005 Document Revised: 01/19/2016 Document Reviewed:  04/16/2015 Elsevier Interactive Patient Education  2018 [employer]  

## 2017-12-30 NOTE — Progress Notes (Signed)
Name: Traci Bowen   MRN: [id number]    DOB: 29-Mar-1941   Date:12/30/2017       Progress Note  Subjective  Chief Complaint  Chief Complaint  Patient presents with  . Tendonitis    R arm pain x 3 weeks worsening. ROM limited.   . Hip Pain    R Hip pain like Tendonitis started 3 weeks ago as well. Does not feel it is sciatic or spinal stenosis.Pain is constant and stays at the hip. Denies injury.     Hip Pain   The incident occurred more than 1 week ago. Incident location: at church. Injury mechanism: turned suddenly. The pain is present in the right hip (right buttock). The quality of the pain is described as aching. The pain is at a severity of 7/10. The pain is moderate. The pain has been intermittent since onset. Pertinent negatives include no loss of motion, numbness or tingling. The symptoms are aggravated by movement. She has tried acetaminophen and NSAIDs for the symptoms. The treatment provided no relief.  Shoulder Pain   The pain is present in the right shoulder. This is a chronic problem. The current episode started more than 1 month ago. The problem occurs constantly. The problem has been gradually worsening. The pain is at a severity of 7/10. The pain is moderate. Associated symptoms include a limited range of motion. Pertinent negatives include no fever, numbness or tingling. Associated symptoms comments: Unable to abduct. She has tried NSAIDS and acetaminophen for the symptoms.    Essential hypertension Chronic Stable Will refer amlodipine 5 mg daily. Recheck 3 mos.    Past Medical History:  Diagnosis Date  . Hyperlipidemia   . Hypertension   . Neuropathy   . Renal disorder   . Spinal stenosis     Past Surgical History:  Procedure Laterality Date  . CATARACT EXTRACTION Bilateral   . EYE SURGERY Left march 2016   cysts removed   . LITHOTRIPSY    . VAGINAL HYSTERECTOMY      Family History  Problem Relation Age of Onset  . Diabetes Mother   . Hypertension Mother    . Cancer Father   . Cancer Brother   . Stroke Sister   . Heart attack Brother     Social History   Socioeconomic History  . Marital status: Married    Spouse name: Not on file  . Number of children: 2  . Years of education: 1 year college  . Highest education level: 12th grade  Occupational History  . Occupation: Retired  Scientific laboratory technician  . Financial resource strain: Not hard at all  . Food insecurity:    Worry: Never true    Inability: Never true  . Transportation needs:    Medical: No    Non-medical: No  Tobacco Use  . Smoking status: Former Smoker    Packs/day: 0.50    Years: 3.00    Pack years: 1.50    Types: Cigarettes    Last attempt to quit: 1989    Years since quitting: 30.6  . Smokeless tobacco: Never Used  . Tobacco comment: smoking cessation materials not required  Substance and Sexual Activity  . Alcohol use: No    Alcohol/week: 0.0 oz  . Drug use: No  . Sexual activity: Never  Lifestyle  . Physical activity:    Days per week: 0 days    Minutes per session: 0 min  . Stress: Not at all  Relationships  . Social connections:  Talks on phone: Patient refused    Gets together: Patient refused    Attends religious service: Patient refused    Active member of club or organization: Patient refused    Attends meetings of clubs or organizations: Patient refused    Relationship status: Married  . Intimate partner violence:    Fear of current or ex partner: No    Emotionally abused: No    Physically abused: No    Forced sexual activity: No  Other Topics Concern  . Not on file  Social History Narrative  . Not on file    Allergies  Allergen Reactions  . Iodinated Diagnostic Agents   . Penicillins     Outpatient Medications Prior to Visit  Medication Sig Dispense Refill  . Cholecalciferol (VITAMIN D) 2000 units tablet Take by mouth.    . gabapentin (NEURONTIN) 300 MG capsule TAKE ONE CAPSULE AT BEDTIME 90 capsule 0  . losartan (COZAAR) 25 MG  tablet Take 1 tablet (25 mg total) by mouth daily. 90 tablet 1  . Multiple Vitamins-Minerals (CENTRUM SILVER PO) Take 1 capsule by mouth daily at 6 (six) AM.    . Omega-3 Fatty Acids (FISH OIL) 1000 MG CAPS Take 1 capsule by mouth 2 (two) times daily.    . simvastatin (ZOCOR) 40 MG tablet TAKE ONE TABLET AT BEDTIME. 90 tablet 0  . amLODipine (NORVASC) 5 MG tablet Take 1 tablet (5 mg total) by mouth daily. 30 tablet 1  . amLODipine (NORVASC) 5 MG tablet TAKE ONE (1) TABLET BY MOUTH ONCE DAILY 90 tablet 0  . vitamin B-12 (CYANOCOBALAMIN) 1000 MCG tablet Take 1,000 mcg by mouth daily.     No facility-administered medications prior to visit.     Review of Systems  Constitutional: Negative for chills, fever, malaise/fatigue and weight loss.  HENT: Negative for ear discharge, ear pain and sore throat.   Eyes: Negative for blurred vision.  Respiratory: Negative for cough, sputum production, shortness of breath and wheezing.   Cardiovascular: Negative for chest pain, palpitations and leg swelling.  Gastrointestinal: Negative for abdominal pain, blood in stool, constipation, diarrhea, heartburn, melena and nausea.  Genitourinary: Negative for dysuria, frequency, hematuria and urgency.  Musculoskeletal: Negative for back pain, joint pain, myalgias and neck pain.  Skin: Negative for rash.  Neurological: Negative for dizziness, tingling, sensory change, focal weakness, numbness and headaches.  Endo/Heme/Allergies: Negative for environmental allergies and polydipsia. Does not bruise/bleed easily.  Psychiatric/Behavioral: Negative for depression and suicidal ideas. The patient is not nervous/anxious and does not have insomnia.      Objective  Vitals:   12/30/17 1052  BP: 132/82  Pulse: 66  Resp: 16  SpO2: 98%  Weight: 164 lb (74.4 kg)  Height: 5\' 5"  (1.651 m)    Physical Exam  Constitutional: She is oriented to person, place, and time. She appears well-developed and well-nourished.  HENT:   Head: Normocephalic.  Right Ear: External ear normal.  Left Ear: External ear normal.  Mouth/Throat: Oropharynx is clear and moist.  Eyes: Pupils are equal, round, and reactive to light. Conjunctivae and EOM are normal. Lids are everted and swept, no foreign bodies found. Left eye exhibits no hordeolum. No foreign body present in the left eye. Right conjunctiva is not injected. Left conjunctiva is not injected. No scleral icterus.  Neck: Normal range of motion. Neck supple. No JVD present. No tracheal deviation present. No thyromegaly present.  Cardiovascular: Normal rate, regular rhythm, normal heart sounds and intact distal pulses. Exam reveals  no gallop and no friction rub.  No murmur heard. Pulmonary/Chest: Effort normal and breath sounds normal. No respiratory distress. She has no wheezes. She has no rales.  Abdominal: Soft. Bowel sounds are normal. She exhibits no mass. There is no hepatosplenomegaly. There is no tenderness. There is no rebound and no guarding.  Musculoskeletal: She exhibits no edema.       Right shoulder: She exhibits decreased range of motion and tenderness.       Right hip: She exhibits tenderness.  Tender supraspiatis tendon/tender ischial bursa  Lymphadenopathy:    She has no cervical adenopathy.  Neurological: She is alert and oriented to person, place, and time. She has normal strength. She displays normal reflexes. No cranial nerve deficit.  Skin: Skin is warm. No rash noted.  Psychiatric: She has a normal mood and affect. Her mood appears not anxious. She does not exhibit a depressed mood.  Nursing note and vitals reviewed.     Assessment & Plan  Problem List Items Addressed This Visit      Cardiovascular and Mediastinum   Essential hypertension    Chronic Stable Will refer amlodipine 5 mg daily. Recheck 3 mos.       Relevant Medications   amLODipine (NORVASC) 5 MG tablet    Other Visit Diagnoses    Supraspinatus tendinitis, right    -  Primary    Subacute. Right shoulder pain with supraspinatis tenderness.  Will initiate meloxicam 7.5 mg. Referral to orthopedics.   Relevant Medications   meloxicam (MOBIC) 7.5 MG tablet   Other Relevant Orders   Ambulatory referral to Orthopedic Surgery   Ischial bursitis of right side       Acute tenderness over ischial process turning in confined area. Initiate meloxicam 7.5 mg. Ortho referral for injection.   Relevant Medications   meloxicam (MOBIC) 7.5 MG tablet   Other Relevant Orders   Ambulatory referral to Orthopedic Surgery      Meds ordered this encounter  Medications  . meloxicam (MOBIC) 7.5 MG tablet    Sig: Take 1 tablet (7.5 mg total) by mouth daily.    Dispense:  30 tablet    Refill:  0  . amLODipine (NORVASC) 5 MG tablet    Sig: Take 1 tablet (5 mg total) by mouth daily.    Dispense:  90 tablet    Refill:  0      Dr. Otilio Miu [facility]  12/30/17

## 2018-01-03 DIAGNOSIS — E1169 Type 2 diabetes mellitus with other specified complication: Secondary | ICD-10-CM | POA: Diagnosis not present

## 2018-01-03 DIAGNOSIS — M7551 Bursitis of right shoulder: Secondary | ICD-10-CM | POA: Diagnosis not present

## 2018-01-03 DIAGNOSIS — M7071 Other bursitis of hip, right hip: Secondary | ICD-10-CM | POA: Diagnosis not present

## 2018-01-13 ENCOUNTER — Encounter: Payer: Self-pay | Admitting: Family Medicine

## 2018-01-13 ENCOUNTER — Ambulatory Visit (INDEPENDENT_AMBULATORY_CARE_PROVIDER_SITE_OTHER): Payer: Medicare Other | Admitting: Family Medicine

## 2018-01-13 VITALS — BP 122/80 | HR 60 | Ht 65.0 in | Wt 166.0 lb

## 2018-01-13 DIAGNOSIS — I10 Essential (primary) hypertension: Secondary | ICD-10-CM | POA: Diagnosis not present

## 2018-01-13 DIAGNOSIS — E782 Mixed hyperlipidemia: Secondary | ICD-10-CM

## 2018-01-13 MED ORDER — SIMVASTATIN 40 MG PO TABS: 40.0000 mg | ORAL_TABLET | Freq: Every day | ORAL | 1 refills | Status: DC

## 2018-01-13 MED ORDER — LOSARTAN POTASSIUM 25 MG PO TABS: 25.0000 mg | ORAL_TABLET | Freq: Every day | ORAL | 1 refills | Status: DC

## 2018-01-13 MED ORDER — AMLODIPINE BESYLATE 5 MG PO TABS: ORAL_TABLET | ORAL | 1 refills | Status: DC

## 2018-01-13 NOTE — Assessment & Plan Note (Addendum)
Chronic Controlled Continue simvastatin 40 mg daily- stable on meds

## 2018-01-13 NOTE — Progress Notes (Addendum)
Name: Traci Bowen   MRN: [id number]    DOB: 1940/09/10   Date:01/13/2018       Progress Note  Subjective  Chief Complaint  Chief Complaint  Patient presents with  . Hypertension  . Hyperlipidemia    Hypertension  This is a chronic problem. The current episode started more than 1 year ago. The problem has been waxing and waning since onset. The problem is controlled. Pertinent negatives include no anxiety, blurred vision, chest pain, headaches, malaise/fatigue, neck pain, orthopnea, palpitations, peripheral edema, PND, shortness of breath or sweats. There are no associated agents to hypertension. There are no known risk factors for coronary artery disease. Past treatments include calcium channel blockers and angiotensin blockers. The current treatment provides moderate improvement. There are no compliance problems.  There is no history of angina, kidney disease, CAD/MI, CVA, heart failure, left ventricular hypertrophy, PVD or retinopathy. There is no history of chronic renal disease, a hypertension causing med or renovascular disease.  Hyperlipidemia  This is a chronic problem. The current episode started more than 1 year ago. The problem is controlled. Recent lipid tests were reviewed and are normal. She has no history of chronic renal disease, hypothyroidism, liver disease, obesity or nephrotic syndrome. Pertinent negatives include no chest pain, focal sensory loss, focal weakness, leg pain, myalgias or shortness of breath. Current antihyperlipidemic treatment includes statins (omega 3). The current treatment provides moderate improvement of lipids. There are no compliance problems.  Risk factors for coronary artery disease include dyslipidemia and hypertension.    Essential hypertension Chronic Controlled Continue amlodipine 5 mg and losartan 25 mg daily- stable on meds  Hyperlipidemia Chronic Controlled Continue simvastatin 40 mg daily- stable on meds   Past Medical History:  Diagnosis  Date  . Hyperlipidemia   . Hypertension   . Neuropathy   . Renal disorder   . Spinal stenosis     Past Surgical History:  Procedure Laterality Date  . CATARACT EXTRACTION Bilateral   . EYE SURGERY Left march 2016   cysts removed   . LITHOTRIPSY    . VAGINAL HYSTERECTOMY      Family History  Problem Relation Age of Onset  . Diabetes Mother   . Hypertension Mother   . Cancer Father   . Cancer Brother   . Stroke Sister   . Heart attack Brother     Social History   Socioeconomic History  . Marital status: Married    Spouse name: Not on file  . Number of children: 2  . Years of education: 1 year college  . Highest education level: 12th grade  Occupational History  . Occupation: Retired  Scientific laboratory technician  . Financial resource strain: Not hard at all  . Food insecurity:    Worry: Never true    Inability: Never true  . Transportation needs:    Medical: No    Non-medical: No  Tobacco Use  . Smoking status: Former Smoker    Packs/day: 0.50    Years: 3.00    Pack years: 1.50    Types: Cigarettes    Last attempt to quit: 1989    Years since quitting: 30.6  . Smokeless tobacco: Never Used  . Tobacco comment: smoking cessation materials not required  Substance and Sexual Activity  . Alcohol use: No    Alcohol/week: 0.0 standard drinks  . Drug use: No  . Sexual activity: Never  Lifestyle  . Physical activity:    Days per week: 0 days    Minutes  per session: 0 min  . Stress: Not at all  Relationships  . Social connections:    Talks on phone: Patient refused    Gets together: Patient refused    Attends religious service: Patient refused    Active member of club or organization: Patient refused    Attends meetings of clubs or organizations: Patient refused    Relationship status: Married  . Intimate partner violence:    Fear of current or ex partner: No    Emotionally abused: No    Physically abused: No    Forced sexual activity: No  Other Topics Concern  .  Not on file  Social History Narrative  . Not on file    Allergies  Allergen Reactions  . Iodinated Diagnostic Agents   . Penicillins     Outpatient Medications Prior to Visit  Medication Sig Dispense Refill  . Cholecalciferol (VITAMIN D) 2000 units tablet Take by mouth.    . gabapentin (NEURONTIN) 300 MG capsule TAKE (1) CAPSULE BY MOUTH AT BEDTIME 90 capsule 0  . meloxicam (MOBIC) 7.5 MG tablet Take 1 tablet (7.5 mg total) by mouth daily. 30 tablet 0  . Multiple Vitamins-Minerals (CENTRUM SILVER PO) Take 1 capsule by mouth daily at 6 (six) AM.    . Omega-3 Fatty Acids (FISH OIL) 1000 MG CAPS Take 1 capsule by mouth 2 (two) times daily.    . vitamin B-12 (CYANOCOBALAMIN) 1000 MCG tablet Take 1,000 mcg by mouth daily.    Marland Kitchen amLODipine (NORVASC) 5 MG tablet TAKE (1) TABLET BY MOUTH EVERY DAY 90 tablet 0  . losartan (COZAAR) 25 MG tablet Take 1 tablet (25 mg total) by mouth daily. 90 tablet 1  . simvastatin (ZOCOR) 40 MG tablet TAKE ONE TABLET AT BEDTIME. 90 tablet 0  . amLODipine (NORVASC) 5 MG tablet Take 1 tablet (5 mg total) by mouth daily. 90 tablet 0   No facility-administered medications prior to visit.     Review of Systems  Constitutional: Negative for chills, fever, malaise/fatigue and weight loss.  HENT: Negative for ear discharge, ear pain and sore throat.   Eyes: Negative for blurred vision.  Respiratory: Negative for cough, sputum production, shortness of breath and wheezing.   Cardiovascular: Negative for chest pain, palpitations, orthopnea, leg swelling and PND.  Gastrointestinal: Negative for abdominal pain, blood in stool, constipation, diarrhea, heartburn, melena and nausea.  Genitourinary: Negative for dysuria, frequency, hematuria and urgency.  Musculoskeletal: Negative for back pain, joint pain, myalgias and neck pain.  Skin: Negative for rash.  Neurological: Negative for dizziness, tingling, sensory change, focal weakness and headaches.  Endo/Heme/Allergies:  Negative for environmental allergies and polydipsia. Does not bruise/bleed easily.  Psychiatric/Behavioral: Negative for depression and suicidal ideas. The patient is not nervous/anxious and does not have insomnia.      Objective  Vitals:   01/13/18 0853  BP: 122/80  Pulse: 60  Weight: 166 lb (75.3 kg)  Height: 5\' 5"  (1.651 m)    Physical Exam  Constitutional: She is oriented to person, place, and time. She appears well-developed and well-nourished.  HENT:  Head: Normocephalic.  Right Ear: External ear normal.  Left Ear: External ear normal.  Mouth/Throat: Oropharynx is clear and moist.  Eyes: Pupils are equal, round, and reactive to light. Conjunctivae and EOM are normal. Lids are everted and swept, no foreign bodies found. Left eye exhibits no hordeolum. No foreign body present in the left eye. Right conjunctiva is not injected. Left conjunctiva is not injected. No scleral  icterus.  Neck: Normal range of motion. Neck supple. No JVD present. No tracheal deviation present. No thyromegaly present.  Cardiovascular: Normal rate, regular rhythm, normal heart sounds and intact distal pulses. Exam reveals no gallop and no friction rub.  No murmur heard. Pulmonary/Chest: Effort normal and breath sounds normal. No respiratory distress. She has no wheezes. She has no rales.  Abdominal: Soft. Bowel sounds are normal. She exhibits no mass. There is no hepatosplenomegaly. There is no tenderness. There is no rebound and no guarding.  Musculoskeletal: Normal range of motion. She exhibits no edema or tenderness.  Lymphadenopathy:    She has no cervical adenopathy.  Neurological: She is alert and oriented to person, place, and time. She has normal strength. She displays normal reflexes. No cranial nerve deficit.  Skin: Skin is warm. No rash noted.  Psychiatric: She has a normal mood and affect. Her mood appears not anxious. She does not exhibit a depressed mood.  Nursing note and vitals  reviewed.     Assessment & Plan  Problem List Items Addressed This Visit      Cardiovascular and Mediastinum   Essential hypertension - Primary    Chronic Controlled Continue amlodipine 5 mg and losartan 25 mg daily- stable on meds      Relevant Medications   amLODipine (NORVASC) 5 MG tablet   losartan (COZAAR) 25 MG tablet   simvastatin (ZOCOR) 40 MG tablet   Other Relevant Orders   Renal function panel     Other   Hyperlipidemia    Chronic Controlled Continue simvastatin 40 mg daily- stable on meds      Relevant Medications   amLODipine (NORVASC) 5 MG tablet   losartan (COZAAR) 25 MG tablet   simvastatin (ZOCOR) 40 MG tablet   Other Relevant Orders   Lipid panel      Meds ordered this encounter  Medications  . amLODipine (NORVASC) 5 MG tablet    Sig: TAKE (1) TABLET BY MOUTH EVERY DAY    Dispense:  90 tablet    Refill:  1  . losartan (COZAAR) 25 MG tablet    Sig: Take 1 tablet (25 mg total) by mouth daily.    Dispense:  90 tablet    Refill:  1  . simvastatin (ZOCOR) 40 MG tablet    Sig: Take 1 tablet (40 mg total) by mouth at bedtime.    Dispense:  90 tablet    Refill:  1      Dr. Otilio Miu [facility]  01/13/18

## 2018-01-13 NOTE — Assessment & Plan Note (Addendum)
Chronic Controlled Continue amlodipine 5 mg and losartan 25 mg daily- stable on meds

## 2018-01-14 LAB — RENAL FUNCTION PANEL
Albumin: 4.2 g/dL (ref 3.5–4.8)
BUN/Creatinine Ratio: 17 (ref 12–28)
BUN: 11 mg/dL (ref 8–27)
CALCIUM: 10.3 mg/dL (ref 8.7–10.3)
CO2: 26 mmol/L (ref 20–29)
CREATININE: 0.65 mg/dL (ref 0.57–1.00)
Chloride: 101 mmol/L (ref 96–106)
GFR calc Af Amer: 100 mL/min/{1.73_m2} (ref >59)
GFR calc non Af Amer: 87 mL/min/{1.73_m2} (ref >59)
Glucose: 105 mg/dL — ABNORMAL HIGH (ref 65–99)
PHOSPHORUS: 3.2 mg/dL (ref 2.5–4.5)
Potassium: 4.2 mmol/L (ref 3.5–5.2)
Sodium: 143 mmol/L (ref 134–144)

## 2018-01-14 LAB — LIPID PANEL
CHOLESTEROL TOTAL: 166 mg/dL (ref 100–199)
Chol/HDL Ratio: 3.5 ratio (ref 0.0–4.4)
HDL: 47 mg/dL (ref >39)
LDL CALC: 90 mg/dL (ref 0–99)
TRIGLYCERIDES: 143 mg/dL (ref 0–149)
VLDL CHOLESTEROL CAL: 29 mg/dL (ref 5–40)

## 2018-02-12 ENCOUNTER — Ambulatory Visit
Admission: RE | Admit: 2018-02-12 | Discharge: 2018-02-12 | Disposition: A | Discharge status: Home / Self Care | Payer: Medicare Other | Source: Ambulatory Visit | Attending: Family Medicine | Admitting: Family Medicine

## 2018-02-12 ENCOUNTER — Encounter: Payer: Self-pay | Admitting: Family Medicine

## 2018-02-12 ENCOUNTER — Ambulatory Visit (INDEPENDENT_AMBULATORY_CARE_PROVIDER_SITE_OTHER): Payer: Medicare Other | Admitting: Family Medicine

## 2018-02-12 VITALS — BP 138/80 | HR 64 | Temp 98.2°F | Resp 14 | Ht 65.0 in | Wt 167.0 lb

## 2018-02-12 DIAGNOSIS — I10 Essential (primary) hypertension: Secondary | ICD-10-CM

## 2018-02-12 DIAGNOSIS — J40 Bronchitis, not specified as acute or chronic: Secondary | ICD-10-CM | POA: Insufficient documentation

## 2018-02-12 DIAGNOSIS — J986 Disorders of diaphragm: Secondary | ICD-10-CM | POA: Insufficient documentation

## 2018-02-12 DIAGNOSIS — E782 Mixed hyperlipidemia: Secondary | ICD-10-CM

## 2018-02-12 DIAGNOSIS — R918 Other nonspecific abnormal finding of lung field: Secondary | ICD-10-CM | POA: Insufficient documentation

## 2018-02-12 DIAGNOSIS — I517 Cardiomegaly: Secondary | ICD-10-CM | POA: Diagnosis not present

## 2018-02-12 DIAGNOSIS — I700 Atherosclerosis of aorta: Secondary | ICD-10-CM | POA: Diagnosis not present

## 2018-02-12 DIAGNOSIS — J0100 Acute maxillary sinusitis, unspecified: Secondary | ICD-10-CM | POA: Diagnosis not present

## 2018-02-12 MED ORDER — LEVOFLOXACIN 500 MG PO TABS: 500.0000 mg | ORAL_TABLET | Freq: Every day | ORAL | 0 refills | Status: DC

## 2018-02-12 MED ORDER — GUAIFENESIN-CODEINE 100-10 MG/5ML PO SYRP: 5.0000 mL | ORAL_SOLUTION | Freq: Three times a day (TID) | ORAL | 0 refills | Status: DC | PRN

## 2018-02-12 NOTE — Assessment & Plan Note (Signed)
Problem List as of 02/12/2018 Reviewed: 02/12/2018 11:36 AM by Juline Patch, MD     Cardiovascular and Mediastinum   Essential hypertension   Last Assessment & Plan 01/13/2018 Office Visit Edited 01/13/2018 12:41 PM by Juline Patch, MD    Chronic Controlled Continue amlodipine 5 mg and losartan 25 mg daily- stable on meds        Nervous and Auditory   Neuropathy     Other   Hyperlipidemia   Last Assessment & Plan 01/13/2018 Office Visit Edited 01/13/2018 12:42 PM by Juline Patch, MD    Chronic Controlled Continue simvastatin 40 mg daily- stable on meds

## 2018-02-12 NOTE — Assessment & Plan Note (Signed)
Problem List as of 02/12/2018 Reviewed: 02/12/2018 11:36 AM by Juline Patch, MD     Cardiovascular and Mediastinum   Essential hypertension   Last Assessment & Plan 02/12/2018 Office Visit Written 02/12/2018 12:19 PM by Juline Patch, MD    Problem List as of 02/12/2018 Reviewed: 02/12/2018 11:36 AM by Juline Patch, MD     Cardiovascular and Mediastinum   Essential hypertension   Last Assessment & Plan 01/13/2018 Office Visit Edited 01/13/2018 12:41 PM by Juline Patch, MD    Chronic Controlled Continue amlodipine 5 mg and losartan 25 mg daily- stable on meds        Nervous and Auditory   Neuropathy     Other   Hyperlipidemia   Last Assessment & Plan 01/13/2018 Office Visit Edited 01/13/2018 12:42 PM by Juline Patch, MD    Chronic Controlled Continue simvastatin 40 mg daily- stable on meds                Nervous and Auditory   Neuropathy     Other   Hyperlipidemia   Last Assessment & Plan 01/13/2018 Office Visit Edited 01/13/2018 12:42 PM by Juline Patch, MD    Chronic Controlled Continue simvastatin 40 mg daily- stable on meds.

## 2018-02-12 NOTE — Progress Notes (Addendum)
Name: Traci Bowen   MRN: [id number]    DOB: 20-Jan-1941   Date:02/12/2018       Progress Note  Subjective  Chief Complaint  Chief Complaint  Patient presents with  . Sinusitis    cong, yellow production, sore throat, cough, chills    Sinusitis  This is a new problem. The current episode started yesterday. The problem has been gradually worsening since onset. The maximum temperature recorded prior to her arrival was 100.4 - 100.9 F. The pain is mild. Associated symptoms include chills, congestion, coughing, headaches, a hoarse voice, sinus pressure and a sore throat. Pertinent negatives include no diaphoresis, ear pain, neck pain, shortness of breath, sneezing or swollen glands. Past treatments include nothing. The treatment provided moderate relief.    Essential hypertension Problem List as of 02/12/2018 Reviewed: 02/12/2018 11:36 AM by Juline Patch, MD     Cardiovascular and Mediastinum   Essential hypertension   Last Assessment & Plan 01/13/2018 Office Visit Edited 01/13/2018 12:41 PM by Juline Patch, MD    Chronic Controlled Continue amlodipine 5 mg and losartan 25 mg daily- stable on meds        Nervous and Auditory   Neuropathy     Other   Hyperlipidemia   Last Assessment & Plan 01/13/2018 Office Visit Edited 01/13/2018 12:42 PM by Juline Patch, MD    Chronic Controlled Continue simvastatin 40 mg daily- stable on meds           Past Medical History:  Diagnosis Date  . Hyperlipidemia   . Hypertension   . Neuropathy   . Renal disorder   . Spinal stenosis     Past Surgical History:  Procedure Laterality Date  . CATARACT EXTRACTION Bilateral   . EYE SURGERY Left march 2016   cysts removed   . LITHOTRIPSY    . VAGINAL HYSTERECTOMY      Family History  Problem Relation Age of Onset  . Diabetes Mother   . Hypertension Mother   . Cancer Father   . Cancer Brother   . Stroke Sister   . Heart attack Brother     Social History   Socioeconomic History   . Marital status: Married    Spouse name: Not on file  . Number of children: 2  . Years of education: 1 year college  . Highest education level: 12th grade  Occupational History  . Occupation: Retired  Scientific laboratory technician  . Financial resource strain: Not hard at all  . Food insecurity:    Worry: Never true    Inability: Never true  . Transportation needs:    Medical: No    Non-medical: No  Tobacco Use  . Smoking status: Former Smoker    Packs/day: 0.50    Years: 3.00    Pack years: 1.50    Types: Cigarettes    Last attempt to quit: 1989    Years since quitting: 30.7  . Smokeless tobacco: Never Used  . Tobacco comment: smoking cessation materials not required  Substance and Sexual Activity  . Alcohol use: No    Alcohol/week: 0.0 standard drinks  . Drug use: No  . Sexual activity: Never  Lifestyle  . Physical activity:    Days per week: 0 days    Minutes per session: 0 min  . Stress: Not at all  Relationships  . Social connections:    Talks on phone: Patient refused    Gets together: Patient refused    Attends religious service:  Patient refused    Active member of club or organization: Patient refused    Attends meetings of clubs or organizations: Patient refused    Relationship status: Married  . Intimate partner violence:    Fear of current or ex partner: No    Emotionally abused: No    Physically abused: No    Forced sexual activity: No  Other Topics Concern  . Not on file  Social History Narrative  . Not on file    Allergies  Allergen Reactions  . Iodinated Diagnostic Agents   . Penicillins     Outpatient Medications Prior to Visit  Medication Sig Dispense Refill  . amLODipine (NORVASC) 5 MG tablet TAKE (1) TABLET BY MOUTH EVERY DAY 90 tablet 1  . Cholecalciferol (VITAMIN D) 2000 units tablet Take by mouth.    . gabapentin (NEURONTIN) 300 MG capsule TAKE (1) CAPSULE BY MOUTH AT BEDTIME 90 capsule 0  . losartan (COZAAR) 25 MG tablet Take 1 tablet (25 mg  total) by mouth daily. 90 tablet 1  . meloxicam (MOBIC) 7.5 MG tablet Take 1 tablet (7.5 mg total) by mouth daily. 30 tablet 0  . Multiple Vitamins-Minerals (CENTRUM SILVER PO) Take 1 capsule by mouth daily at 6 (six) AM.    . Omega-3 Fatty Acids (FISH OIL) 1000 MG CAPS Take 1 capsule by mouth 2 (two) times daily.    . simvastatin (ZOCOR) 40 MG tablet Take 1 tablet (40 mg total) by mouth at bedtime. 90 tablet 1  . vitamin B-12 (CYANOCOBALAMIN) 1000 MCG tablet Take 1,000 mcg by mouth daily.     No facility-administered medications prior to visit.     Review of Systems  Constitutional: Positive for chills. Negative for diaphoresis, fever, malaise/fatigue and weight loss.  HENT: Positive for congestion, hoarse voice, sinus pressure and sore throat. Negative for ear discharge, ear pain and sneezing.   Eyes: Negative for blurred vision.  Respiratory: Positive for cough. Negative for sputum production, shortness of breath and wheezing.   Cardiovascular: Negative for chest pain, palpitations and leg swelling.  Gastrointestinal: Negative for abdominal pain, blood in stool, constipation, diarrhea, heartburn, melena and nausea.  Genitourinary: Negative for dysuria, frequency, hematuria and urgency.  Musculoskeletal: Negative for back pain, joint pain, myalgias and neck pain.  Skin: Negative for rash.  Neurological: Positive for headaches. Negative for dizziness, tingling, sensory change and focal weakness.  Endo/Heme/Allergies: Negative for environmental allergies and polydipsia. Does not bruise/bleed easily.  Psychiatric/Behavioral: Negative for depression and suicidal ideas. The patient is not nervous/anxious and does not have insomnia.      Objective  Vitals:   02/12/18 1121  BP: 138/80  Pulse: 64  Resp: 14  Temp: 98.2 F (36.8 C)  TempSrc: Oral  SpO2: 95%  Weight: 167 lb (75.8 kg)  Height: 5\' 5"  (1.651 m)    Physical Exam  Constitutional: No distress.  HENT:  Head: Normocephalic  and atraumatic.  Right Ear: External ear normal.  Left Ear: External ear normal.  Nose: Nose normal.  Mouth/Throat: Oropharynx is clear and moist.  Eyes: Pupils are equal, round, and reactive to light. Conjunctivae and EOM are normal. Right eye exhibits no discharge. Left eye exhibits no discharge.  Neck: Normal range of motion. Neck supple. No JVD present. No thyromegaly present.  Cardiovascular: Normal rate, regular rhythm, normal heart sounds and intact distal pulses. Exam reveals no gallop and no friction rub.  No murmur heard. Pulmonary/Chest: Effort normal. She has no decreased breath sounds. She has wheezes.  She has rhonchi. She has rales.  Abdominal: Soft. Bowel sounds are normal. She exhibits no mass. There is no tenderness. There is no guarding.  Musculoskeletal: Normal range of motion. She exhibits no edema.  Lymphadenopathy:    She has no cervical adenopathy.  Neurological: She is alert. She has normal reflexes.  Skin: Skin is warm and dry. She is not diaphoretic.  Nursing note and vitals reviewed.     Assessment & Plan  Problem List Items Addressed This Visit      Cardiovascular and Mediastinum   Essential hypertension    Problem List as of 02/12/2018 Reviewed: 02/12/2018 11:36 AM by Juline Patch, MD     Cardiovascular and Mediastinum   Essential hypertension   Last Assessment & Plan 01/13/2018 Office Visit Edited 01/13/2018 12:41 PM by Juline Patch, MD    Chronic Controlled Continue amlodipine 5 mg and losartan 25 mg daily- stable on meds        Nervous and Auditory   Neuropathy     Other   Hyperlipidemia   Last Assessment & Plan 01/13/2018 Office Visit Edited 01/13/2018 12:42 PM by Juline Patch, MD    Chronic Controlled Continue simvastatin 40 mg daily- stable on meds               Other Visit Diagnoses    Acute maxillary sinusitis, recurrence not specified    -  Primary   start levaquin and cough syrup/ get chest xray   Relevant Medications    levofloxacin (LEVAQUIN) 500 MG tablet   guaiFENesin-codeine (ROBITUSSIN AC) 100-10 MG/5ML syrup   Bronchitis       prescribed levaquin and cough syrup/ get a chest xray   Relevant Medications   guaiFENesin-codeine (ROBITUSSIN AC) 100-10 MG/5ML syrup   Other Relevant Orders   DG Chest 2 View      Meds ordered this encounter  Medications  . levofloxacin (LEVAQUIN) 500 MG tablet    Sig: Take 1 tablet (500 mg total) by mouth daily.    Dispense:  7 tablet    Refill:  0  . guaiFENesin-codeine (ROBITUSSIN AC) 100-10 MG/5ML syrup    Sig: Take 5 mLs by mouth 3 (three) times daily as needed for cough.    Dispense:  150 mL    Refill:  0      Dr. [facility]  02/12/18

## 2018-02-13 ENCOUNTER — Other Ambulatory Visit: Payer: Self-pay

## 2018-02-20 ENCOUNTER — Telehealth: Payer: Self-pay

## 2018-02-20 DIAGNOSIS — J0100 Acute maxillary sinusitis, unspecified: Secondary | ICD-10-CM

## 2018-02-20 DIAGNOSIS — J189 Pneumonia, unspecified organism: Secondary | ICD-10-CM

## 2018-02-20 MED ORDER — BENZONATATE 200 MG PO CAPS: 200.0000 mg | ORAL_CAPSULE | Freq: Two times a day (BID) | ORAL | 0 refills | Status: DC | PRN

## 2018-02-20 MED ORDER — LEVOFLOXACIN 500 MG PO TABS
500.0000 mg | ORAL_TABLET | Freq: Every day | ORAL | 0 refills | Status: DC
Start: 2018-02-20 — End: 2018-07-16

## 2018-02-20 NOTE — Telephone Encounter (Signed)
Pt called stating she is better but not 100%. Still has a cough. Sent in 3 more days of Levaquin and tessalon perles to [facility]. Need to see Monday if not better

## 2018-02-27 ENCOUNTER — Encounter: Payer: Self-pay | Admitting: Family Medicine

## 2018-02-27 ENCOUNTER — Other Ambulatory Visit: Payer: Self-pay

## 2018-02-27 ENCOUNTER — Ambulatory Visit (INDEPENDENT_AMBULATORY_CARE_PROVIDER_SITE_OTHER): Payer: Medicare Other | Admitting: Family Medicine

## 2018-02-27 VITALS — BP 140/80 | HR 88 | Ht 65.0 in | Wt 163.0 lb

## 2018-02-27 DIAGNOSIS — J40 Bronchitis, not specified as acute or chronic: Secondary | ICD-10-CM

## 2018-02-27 DIAGNOSIS — J4541 Moderate persistent asthma with (acute) exacerbation: Secondary | ICD-10-CM

## 2018-02-27 MED ORDER — ALBUTEROL SULFATE (2.5 MG/3ML) 0.083% IN NEBU: 2.5000 mg | INHALATION_SOLUTION | Freq: Four times a day (QID) | RESPIRATORY_TRACT | 1 refills | Status: DC | PRN

## 2018-02-27 MED ORDER — IPRATROPIUM-ALBUTEROL 0.5-2.5 (3) MG/3ML IN SOLN
3.0000 mL | Freq: Once | RESPIRATORY_TRACT | Status: AC
  Administered 2018-02-27: 3 mL via RESPIRATORY_TRACT

## 2018-02-27 MED ORDER — IPRATROPIUM-ALBUTEROL 0.5-2.5 (3) MG/3ML IN SOLN: 3.0000 mL | Freq: Four times a day (QID) | RESPIRATORY_TRACT | 1 refills | Status: DC | PRN

## 2018-02-27 MED ORDER — PREDNISONE 10 MG PO TABS: 10.0000 mg | ORAL_TABLET | Freq: Every day | ORAL | 0 refills | Status: DC

## 2018-02-27 MED ORDER — DOXYCYCLINE HYCLATE 100 MG PO TABS: 100.0000 mg | ORAL_TABLET | Freq: Two times a day (BID) | ORAL | 0 refills | Status: DC

## 2018-02-27 NOTE — Progress Notes (Signed)
Date:  02/27/2018   Name:  Traci Bowen   DOB:  30-Jul-1940   MRN:  [id number]   Chief Complaint: Follow-up (pneumonia- has had 10 days of Levaquin and rob AC/ Tessalon perles- coughing spells and not moving air ) Cough  This is a recurrent problem. The current episode started 1 to 4 weeks ago. The problem has been waxing and waning. The problem occurs constantly. The cough is productive of purulent sputum (yellow "looks like beer"). Pertinent negatives include no chest pain, chills, ear congestion, ear pain, eye redness, fever, headaches, hemoptysis, myalgias, nasal congestion, postnasal drip, rash, rhinorrhea, sore throat, shortness of breath, weight loss or wheezing. Treatments tried: levaquin. The treatment provided moderate relief. There is no history of asthma, bronchiectasis, bronchitis, COPD, emphysema, environmental allergies or pneumonia.    Review of Systems  Constitutional: Negative.  Negative for chills, fatigue, fever, unexpected weight change and weight loss.  HENT: Negative for congestion, ear discharge, ear pain, postnasal drip, rhinorrhea, sinus pressure, sneezing and sore throat.   Eyes: Negative for photophobia, pain, discharge, redness and itching.  Respiratory: Negative for cough, hemoptysis, shortness of breath, wheezing and stridor.   Cardiovascular: Negative for chest pain.  Gastrointestinal: Negative for abdominal pain, blood in stool, constipation, diarrhea, nausea and vomiting.  Endocrine: Negative for cold intolerance, heat intolerance, polydipsia, polyphagia and polyuria.  Genitourinary: Negative for dysuria, flank pain, frequency, hematuria, menstrual problem, pelvic pain, urgency, vaginal bleeding and vaginal discharge.  Musculoskeletal: Negative for arthralgias, back pain and myalgias.  Skin: Negative for rash.  Allergic/Immunologic: Negative for environmental allergies and food allergies.  Neurological: Negative for dizziness, weakness, light-headedness,  numbness and headaches.  Hematological: Negative for adenopathy. Does not bruise/bleed easily.  Psychiatric/Behavioral: Negative for dysphoric mood. The patient is not nervous/anxious.     Patient Active Problem List   Diagnosis Date Noted  . Neuropathy 01/06/2016  . Essential hypertension 06/01/2015  . Hyperlipidemia 06/01/2015    Allergies  Allergen Reactions  . Iodinated Diagnostic Agents   . Penicillins     Past Surgical History:  Procedure Laterality Date  . CATARACT EXTRACTION Bilateral   . EYE SURGERY Left march 2016   cysts removed   . LITHOTRIPSY    . VAGINAL HYSTERECTOMY      Social History   Tobacco Use  . Smoking status: Former Smoker    Packs/day: 0.50    Years: 3.00    Pack years: 1.50    Types: Cigarettes    Last attempt to quit: 1989    Years since quitting: 30.7  . Smokeless tobacco: Never Used  . Tobacco comment: smoking cessation materials not required  Substance Use Topics  . Alcohol use: No    Alcohol/week: 0.0 standard drinks  . Drug use: No     Medication list has been reviewed and updated.  Current Meds  Medication Sig  . amLODipine (NORVASC) 5 MG tablet TAKE (1) TABLET BY MOUTH EVERY DAY  . benzonatate (TESSALON) 200 MG capsule Take 1 capsule (200 mg total) by mouth 2 (two) times daily as needed for cough.  . Cholecalciferol (VITAMIN D) 2000 units tablet Take by mouth.  . gabapentin (NEURONTIN) 300 MG capsule TAKE (1) CAPSULE BY MOUTH AT BEDTIME  . losartan (COZAAR) 25 MG tablet Take 1 tablet (25 mg total) by mouth daily.  . meloxicam (MOBIC) 7.5 MG tablet Take 1 tablet (7.5 mg total) by mouth daily.  . Multiple Vitamins-Minerals (CENTRUM SILVER PO) Take 1 capsule by mouth daily at 6 (  six) AM.  . Omega-3 Fatty Acids (FISH OIL) 1000 MG CAPS Take 1 capsule by mouth 2 (two) times daily.  . simvastatin (ZOCOR) 40 MG tablet Take 1 tablet (40 mg total) by mouth at bedtime.  . vitamin B-12 (CYANOCOBALAMIN) 1000 MCG tablet Take 1,000 mcg by  mouth daily.    PHQ 2/9 Scores 01/13/2018 08/15/2017 02/13/2017 02/13/2017  PHQ - 2 Score 0 0 0 0  PHQ- 9 Score 0 0 0 -    Physical Exam  BP 140/80   Pulse 88   Ht 5\' 5"  (1.651 m)   Wt 163 lb (73.9 kg)   SpO2 (!) 89%   BMI 27.12 kg/m   Assessment and Plan:  1. Moderate persistent reactive airway disease with acute exacerbation Gave inhalation treatment/ prescribed pred - ipratropium-albuterol (DUONEB) 0.5-2.5 (3) MG/3ML SOLN; Take 3 mLs by nebulization every 6 (six) hours as needed.  Dispense: 360 mL; Refill: 1 - predniSONE (DELTASONE) 10 MG tablet; Take 1 tablet (10 mg total) by mouth daily with breakfast.  Dispense: 30 tablet; Refill: 0  2. Bronchitis Prescribed Doxy and prednisone/ gave inhalation treatment- 02 came up to 96% - doxycycline (VIBRA-TABS) 100 MG tablet; Take 1 tablet (100 mg total) by mouth 2 (two) times daily.  Dispense: 20 tablet; Refill: 0   Dr. [facility]  02/27/2018

## 2018-03-17 ENCOUNTER — Other Ambulatory Visit: Payer: Self-pay | Admitting: Family Medicine

## 2018-03-17 DIAGNOSIS — M48061 Spinal stenosis, lumbar region without neurogenic claudication: Secondary | ICD-10-CM

## 2018-04-01 DIAGNOSIS — Z23 Encounter for immunization: Secondary | ICD-10-CM | POA: Diagnosis not present

## 2018-06-26 ENCOUNTER — Other Ambulatory Visit: Payer: Self-pay | Admitting: Family Medicine

## 2018-06-26 DIAGNOSIS — M48061 Spinal stenosis, lumbar region without neurogenic claudication: Secondary | ICD-10-CM

## 2018-07-16 ENCOUNTER — Ambulatory Visit (INDEPENDENT_AMBULATORY_CARE_PROVIDER_SITE_OTHER): Payer: Medicare Other | Admitting: Family Medicine

## 2018-07-16 ENCOUNTER — Encounter: Payer: Self-pay | Admitting: Family Medicine

## 2018-07-16 VITALS — BP 138/80 | HR 60 | Ht 65.0 in | Wt 168.0 lb

## 2018-07-16 DIAGNOSIS — L723 Sebaceous cyst: Secondary | ICD-10-CM | POA: Diagnosis not present

## 2018-07-16 DIAGNOSIS — E782 Mixed hyperlipidemia: Secondary | ICD-10-CM | POA: Diagnosis not present

## 2018-07-16 DIAGNOSIS — J069 Acute upper respiratory infection, unspecified: Secondary | ICD-10-CM

## 2018-07-16 DIAGNOSIS — I10 Essential (primary) hypertension: Secondary | ICD-10-CM

## 2018-07-16 MED ORDER — LOSARTAN POTASSIUM 25 MG PO TABS: 25.0000 mg | ORAL_TABLET | Freq: Every day | ORAL | 1 refills | Status: DC

## 2018-07-16 MED ORDER — AMLODIPINE BESYLATE 5 MG PO TABS: ORAL_TABLET | ORAL | 1 refills | Status: DC

## 2018-07-16 MED ORDER — MONTELUKAST SODIUM 10 MG PO TABS: 10.0000 mg | ORAL_TABLET | Freq: Every day | ORAL | Status: DC

## 2018-07-16 MED ORDER — SIMVASTATIN 40 MG PO TABS: 40.0000 mg | ORAL_TABLET | Freq: Every day | ORAL | 1 refills | Status: DC

## 2018-07-16 MED ORDER — FISH OIL 1000 MG PO CAPS: 1.0000 | ORAL_CAPSULE | Freq: Two times a day (BID) | ORAL | 3 refills | Status: AC

## 2018-07-16 NOTE — Progress Notes (Signed)
Date:  07/16/2018   Name:  Traci Bowen   DOB:  20-Apr-1941   MRN:  [id number]   Chief Complaint: Hypertension; Hyperlipidemia; and Peripheral Neuropathy  Hypertension  This is a chronic problem. The current episode started more than 1 year ago. The problem has been gradually worsening since onset. The problem is controlled. Pertinent negatives include no anxiety, blurred vision, chest pain, headaches, malaise/fatigue, neck pain, orthopnea, palpitations, peripheral edema, PND, shortness of breath or sweats. There are no associated agents to hypertension. There are no known risk factors for coronary artery disease. Past treatments include calcium channel blockers and angiotensin blockers. There are no compliance problems.  There is no history of angina, kidney disease, CAD/MI, CVA, heart failure, left ventricular hypertrophy, PVD or retinopathy. There is no history of chronic renal disease, a hypertension causing med or renovascular disease.  Hyperlipidemia  This is a chronic problem. The current episode started more than 1 year ago. The problem is controlled. Recent lipid tests were reviewed and are normal. She has no history of chronic renal disease, diabetes, hypothyroidism, liver disease, obesity or nephrotic syndrome. Pertinent negatives include no chest pain, focal sensory loss, focal weakness, leg pain, myalgias or shortness of breath. Current antihyperlipidemic treatment includes statins. The current treatment provides moderate improvement of lipids. There are no compliance problems.  There are no known risk factors for coronary artery disease.  URI   This is a chronic problem. The current episode started in the past 7 days. The problem has been unchanged. There has been no fever. The fever has been present for less than 1 day. Associated symptoms include congestion, sneezing and a sore throat. Pertinent negatives include no abdominal pain, chest pain, coughing, diarrhea, dysuria, ear pain,  headaches, joint pain, joint swelling, nausea, neck pain, plugged ear sensation, rash, rhinorrhea, sinus pain, swollen glands, vomiting or wheezing. She has tried nothing for the symptoms.    Review of Systems  Constitutional: Negative.  Negative for chills, fatigue, fever, malaise/fatigue and unexpected weight change.  HENT: Positive for congestion, sneezing and sore throat. Negative for ear discharge, ear pain, rhinorrhea, sinus pressure and sinus pain.   Eyes: Negative for blurred vision, photophobia, pain, discharge, redness and itching.  Respiratory: Negative for cough, shortness of breath, wheezing and stridor.   Cardiovascular: Negative for chest pain, palpitations, orthopnea and PND.  Gastrointestinal: Negative for abdominal pain, blood in stool, constipation, diarrhea, nausea and vomiting.  Endocrine: Negative for cold intolerance, heat intolerance, polydipsia, polyphagia and polyuria.  Genitourinary: Negative for dysuria, flank pain, frequency, hematuria, menstrual problem, pelvic pain, urgency, vaginal bleeding and vaginal discharge.  Musculoskeletal: Negative for arthralgias, back pain, joint pain, myalgias and neck pain.  Skin: Negative for rash.  Allergic/Immunologic: Negative for environmental allergies and food allergies.  Neurological: Negative for dizziness, focal weakness, weakness, light-headedness, numbness and headaches.  Hematological: Negative for adenopathy. Does not bruise/bleed easily.  Psychiatric/Behavioral: Negative for dysphoric mood. The patient is not nervous/anxious.     Patient Active Problem List   Diagnosis Date Noted  . Neuropathy 01/06/2016  . Essential hypertension 06/01/2015  . Hyperlipidemia 06/01/2015    Allergies  Allergen Reactions  . Iodinated Diagnostic Agents   . Penicillins     Past Surgical History:  Procedure Laterality Date  . CATARACT EXTRACTION Bilateral   . EYE SURGERY Left march 2016   cysts removed   . LITHOTRIPSY    .  VAGINAL HYSTERECTOMY      Social History   Tobacco Use  .  Smoking status: Former Smoker    Packs/day: 0.50    Years: 3.00    Pack years: 1.50    Types: Cigarettes    Last attempt to quit: 1989    Years since quitting: 31.1  . Smokeless tobacco: Never Used  . Tobacco comment: smoking cessation materials not required  Substance Use Topics  . Alcohol use: No    Alcohol/week: 0.0 standard drinks  . Drug use: No     Medication list has been reviewed and updated.  Current Meds  Medication Sig  . albuterol (PROVENTIL) (2.5 MG/3ML) 0.083% nebulizer solution Take 3 mLs (2.5 mg total) by nebulization every 6 (six) hours as needed for wheezing or shortness of breath.  Marland Kitchen amLODipine (NORVASC) 5 MG tablet TAKE (1) TABLET BY MOUTH EVERY DAY  . Cholecalciferol (VITAMIN D) 2000 units tablet Take by mouth.  . gabapentin (NEURONTIN) 300 MG capsule TAKE 1 CAPSULE BY MOUTH AT BEDTIME  . losartan (COZAAR) 25 MG tablet Take 1 tablet (25 mg total) by mouth daily.  . Multiple Vitamins-Minerals (CENTRUM SILVER PO) Take 1 capsule by mouth daily at 6 (six) AM.  . Omega-3 Fatty Acids (FISH OIL) 1000 MG CAPS Take 1 capsule by mouth 2 (two) times daily.  . simvastatin (ZOCOR) 40 MG tablet Take 1 tablet (40 mg total) by mouth at bedtime.  . vitamin B-12 (CYANOCOBALAMIN) 1000 MCG tablet Take 1,000 mcg by mouth daily.    PHQ 2/9 Scores 01/13/2018 08/15/2017 02/13/2017 02/13/2017  PHQ - 2 Score 0 0 0 0  PHQ- 9 Score 0 0 0 -    Physical Exam Vitals signs and nursing note reviewed.  Constitutional:      General: She is not in acute distress.    Appearance: She is not diaphoretic.  HENT:     Head: Normocephalic and atraumatic.     Right Ear: External ear normal.     Left Ear: External ear normal.     Nose: Nose normal.  Eyes:     General:        Right eye: No discharge.        Left eye: No discharge.     Conjunctiva/sclera: Conjunctivae normal.     Pupils: Pupils are equal, round, and reactive to  light.  Neck:     Musculoskeletal: Normal range of motion and neck supple.     Thyroid: No thyromegaly.     Vascular: No JVD.  Cardiovascular:     Rate and Rhythm: Normal rate and regular rhythm.     Heart sounds: Normal heart sounds. No murmur. No friction rub. No gallop.   Pulmonary:     Effort: Pulmonary effort is normal.     Breath sounds: Normal breath sounds.  Abdominal:     General: Bowel sounds are normal.     Palpations: Abdomen is soft. There is no mass.     Tenderness: There is no abdominal tenderness. There is no guarding.  Musculoskeletal: Normal range of motion.  Lymphadenopathy:     Cervical: No cervical adenopathy.  Skin:    General: Skin is warm and dry.     Comments: 2 cm cyst head  Neurological:     Mental Status: She is alert.     Deep Tendon Reflexes: Reflexes are normal and symmetric.     BP 138/80   Pulse 60   Ht 5\' 5"  (1.651 m)   Wt 168 lb (76.2 kg)   BMI 27.96 kg/m   Assessment and Plan:  1.  Essential hypertension Chronic. Controlled on losartan and Amlodipine. Refill losartan 25mg  and amlodipine 5mg / draw renal panel - losartan (COZAAR) 25 MG tablet; Take 1 tablet (25 mg total) by mouth daily.  Dispense: 90 tablet; Refill: 1 - amLODipine (NORVASC) 5 MG tablet; TAKE (1) TABLET BY MOUTH EVERY DAY  Dispense: 90 tablet; Refill: 1 - Renal Function Panel  2. Mixed hyperlipidemia Chronic. Controlled. Refill Simvastatin - simvastatin (ZOCOR) 40 MG tablet; Take 1 tablet (40 mg total) by mouth at bedtime.  Dispense: 90 tablet; Refill: 1  3. Sebaceous cyst Raised area in the center of head. Noticed by hairdresser as getting larger each time getting hair washed. Will refer to surgery as patient has been to dermatology and was told to "watch it" - Ambulatory referral to General Surgery  4. Upper respiratory tract infection, unspecified type Acute. Instructed to pick up Mucinex and Flonase nasal spray. Will prescribe Singulair to take with them. -  montelukast (SINGULAIR) 10 MG tablet; Take 1 tablet (10 mg total) by mouth at bedtime.  Dispense: 14 tablet; Refill: o

## 2018-07-17 LAB — RENAL FUNCTION PANEL
Albumin: 4.5 g/dL (ref 3.7–4.7)
BUN/Creatinine Ratio: 15 (ref 12–28)
BUN: 9 mg/dL (ref 8–27)
CO2: 28 mmol/L (ref 20–29)
Calcium: 9.9 mg/dL (ref 8.7–10.3)
Chloride: 100 mmol/L (ref 96–106)
Creatinine, Ser: 0.61 mg/dL (ref 0.57–1.00)
GFR calc Af Amer: 101 mL/min/{1.73_m2} (ref >59)
GFR calc non Af Amer: 88 mL/min/{1.73_m2} (ref >59)
Glucose: 117 mg/dL — ABNORMAL HIGH (ref 65–99)
Phosphorus: 2.8 mg/dL — ABNORMAL LOW (ref 3.0–4.3)
Potassium: 3.6 mmol/L (ref 3.5–5.2)
Sodium: 144 mmol/L (ref 134–144)

## 2018-07-25 ENCOUNTER — Encounter: Payer: Self-pay | Admitting: Surgery

## 2018-07-25 ENCOUNTER — Other Ambulatory Visit: Payer: Self-pay

## 2018-07-25 ENCOUNTER — Ambulatory Visit (INDEPENDENT_AMBULATORY_CARE_PROVIDER_SITE_OTHER): Payer: Medicare Other | Admitting: Surgery

## 2018-07-25 DIAGNOSIS — L723 Sebaceous cyst: Secondary | ICD-10-CM | POA: Diagnosis not present

## 2018-07-25 NOTE — Patient Instructions (Addendum)
Patient will need to return to the office as needed.    Call the office with any questions or concerns. 

## 2018-07-25 NOTE — Progress Notes (Signed)
07/25/2018  Reason for Visit:  Sebaceous cysts of scalp  Referring Provider:  Otilio Miu, MD  History of Present Illness: Traci Bowen is a 78 y.o. female presenting for evaluation of sebaceous cysts of her scalp.  Patient reports that she's had them for years and had not caused any problems at all.  She went to her hairstylist back to back weeks last week and was told by her hairstylist that her larger cyst may have gotten bigger.  Patient wanted to be evaluated just in case.  Otherwise she denies any symptoms from them.  She reports one is larger and the other is much smaller.  Denies having any tenderness with either one.  Denies having any drainage from them.  Denies any erythema surrounding the cysts.  Past Medical History: Past Medical History:  Diagnosis Date  . Hyperlipidemia   . Hypertension   . Neuropathy   . Renal disorder   . Spinal stenosis      Past Surgical History: Past Surgical History:  Procedure Laterality Date  . CATARACT EXTRACTION Bilateral   . EYE SURGERY Left march 2016   cysts removed   . LITHOTRIPSY    . VAGINAL HYSTERECTOMY      Home Medications: Prior to Admission medications   Medication Sig Start Date End Date Taking? Authorizing Provider  albuterol (PROVENTIL) (2.5 MG/3ML) 0.083% nebulizer solution Take 3 mLs (2.5 mg total) by nebulization every 6 (six) hours as needed for wheezing or shortness of breath. 02/27/18  Yes Juline Patch, MD  amLODipine (NORVASC) 5 MG tablet TAKE (1) TABLET BY MOUTH EVERY DAY 07/16/18  Yes Juline Patch, MD  Cholecalciferol (VITAMIN D) 2000 units tablet Take by mouth.   Yes [provider]  gabapentin (NEURONTIN) 300 MG capsule TAKE 1 CAPSULE BY MOUTH AT BEDTIME 06/26/18  Yes Juline Patch, MD  losartan (COZAAR) 25 MG tablet Take 1 tablet (25 mg total) by mouth daily. 07/16/18  Yes Juline Patch, MD  montelukast (SINGULAIR) 10 MG tablet Take 1 tablet (10 mg total) by mouth at bedtime. 07/16/18  Yes Juline Patch, MD  Multiple Vitamins-Minerals (CENTRUM SILVER PO) Take 1 capsule by mouth daily at 6 (six) AM.   Yes [provider]  Omega-3 Fatty Acids (FISH OIL) 1000 MG CAPS Take 1 capsule (1,000 mg total) by mouth 2 (two) times daily. 07/16/18  Yes Juline Patch, MD  simvastatin (ZOCOR) 40 MG tablet Take 1 tablet (40 mg total) by mouth at bedtime. 07/16/18  Yes Juline Patch, MD  vitamin B-12 (CYANOCOBALAMIN) 1000 MCG tablet Take 1,000 mcg by mouth daily.   Yes [provider]    Allergies: Allergies  Allergen Reactions  . Iodinated Diagnostic Agents   . Penicillins     Social History:  reports that she quit smoking about 31 years ago. Her smoking use included cigarettes. She has a 1.50 pack-year smoking history. She has never used smokeless tobacco. She reports that she does not drink alcohol or use drugs.   Family History: Family History  Problem Relation Age of Onset  . Diabetes Mother   . Hypertension Mother   . Cancer Father   . Cancer Brother   . Stroke Sister   . Heart attack Brother     Review of Systems: Review of Systems  Constitutional: Negative for chills and fever.  HENT: Negative for hearing loss.   Respiratory: Negative for shortness of breath.   Cardiovascular: Negative for chest pain.  Gastrointestinal:  Negative for abdominal pain, nausea and vomiting.  Genitourinary: Negative for dysuria.  Musculoskeletal: Negative for myalgias.  Skin: Negative for rash.  Neurological: Negative for dizziness.  Psychiatric/Behavioral: Negative for depression.    Physical Exam BP (!) 180/78   Pulse 79   Temp 97.7 F (36.5 C) (Temporal)   Ht 5\' 5"  (1.651 m)   Wt 172 lb 3.2 oz (78.1 kg)   SpO2 97%   BMI 28.66 kg/m  CONSTITUTIONAL: No acute distress HEENT:  Normocephalic, atraumatic, extraocular motion intact. NECK: Trachea is midline, and there is no jugular venous distension.  RESPIRATORY:  Lungs are clear, and breath sounds are equal  bilaterally. Normal respiratory effort without pathologic use of accessory muscles. CARDIOVASCULAR: Heart is regular without murmurs, gallops, or rubs. GI: The abdomen is soft, nondistended, nontender.  MUSCULOSKELETAL:  Normal muscle strength and tone in all four extremities.  No peripheral edema or cyanosis. SKIN: Patient has two sebaceous cysts on the top of her scalp.  One measures about 2 cm in size and the other about 5 mm.  They are side to side, separated about 1 cm.  Both are soft, with no erythema, no fluctuance, and non-tender.  NEUROLOGIC:  Motor and sensation is grossly normal.  Cranial nerves are grossly intact. PSYCH:  Alert and oriented to person, place and time. Affect is normal.  Laboratory Analysis: No results found for this or any previous visit (from the past 24 hour(s)).  Imaging: No results found.  Assessment and Plan: This is a 78 y.o. female with two sebaceous cysts of her scalp.  --Discussed with the patient that these cysts are benign and since this is the first time I examine her, I could not tell her if they have grown or not.  She does not feel like they have grown recently, but more slowly through the years.  This is less concerning.  Since she is asymptomatic and there is no evidence of any infection, there is no need for any surgical procedures.   --Discussed with the patient that I'd be happy to help if she ever decides to have them excised or if they for some reason become infected and needs I&D.  If she elects to have them excised, this is something that could potentially be done in office.  Discussed with her that to excise them, I would need to clip the hair around the cysts but that the hair would grow back. --Patient can follow up prn.  Face-to-face time spent with the patient and care providers was 30 minutes, with more than 50% of the time spent counseling, educating, and coordinating care of the patient.     Melvyn Neth, [facility]

## 2018-08-14 ENCOUNTER — Other Ambulatory Visit: Payer: Self-pay | Admitting: Family Medicine

## 2018-08-14 DIAGNOSIS — E782 Mixed hyperlipidemia: Secondary | ICD-10-CM

## 2018-08-14 DIAGNOSIS — I10 Essential (primary) hypertension: Secondary | ICD-10-CM

## 2018-08-20 ENCOUNTER — Ambulatory Visit: Payer: Medicare Other

## 2018-08-27 ENCOUNTER — Ambulatory Visit: Payer: Medicare Other

## 2018-09-23 ENCOUNTER — Other Ambulatory Visit: Payer: Self-pay | Admitting: Family Medicine

## 2018-09-23 DIAGNOSIS — M48061 Spinal stenosis, lumbar region without neurogenic claudication: Secondary | ICD-10-CM

## 2018-09-25 ENCOUNTER — Other Ambulatory Visit: Payer: Self-pay

## 2018-09-25 ENCOUNTER — Ambulatory Visit (INDEPENDENT_AMBULATORY_CARE_PROVIDER_SITE_OTHER): Payer: Medicare Other | Admitting: Family Medicine

## 2018-09-25 ENCOUNTER — Encounter: Payer: Self-pay | Admitting: Family Medicine

## 2018-09-25 VITALS — BP 130/64 | HR 64 | Ht 65.0 in | Wt 172.0 lb

## 2018-09-25 DIAGNOSIS — N3090 Cystitis, unspecified without hematuria: Secondary | ICD-10-CM

## 2018-09-25 LAB — POCT URINALYSIS DIPSTICK
Bilirubin, UA: NEGATIVE
Glucose, UA: NEGATIVE
Ketones, UA: NEGATIVE
Nitrite, UA: NEGATIVE
Protein, UA: POSITIVE — AB
Spec Grav, UA: 1.02 (ref 1.010–1.025)
Urobilinogen, UA: 0.2 E.U./dL
pH, UA: 6 (ref 5.0–8.0)

## 2018-09-25 MED ORDER — NITROFURANTOIN MONOHYD MACRO 100 MG PO CAPS: 100.0000 mg | ORAL_CAPSULE | Freq: Two times a day (BID) | ORAL | 0 refills | Status: DC

## 2018-09-25 NOTE — Progress Notes (Signed)
Date:  09/25/2018   Name:  Traci Bowen   DOB:  April 28, 1941   MRN:  [id number]   Chief Complaint: Urinary Tract Infection (frequent urination/ only a "small amount at a time" burning at end)  Urinary Tract Infection   This is a new problem. The current episode started in the past 7 days (2 days). The problem occurs intermittently. The problem has been gradually worsening. The quality of the pain is described as burning. The pain is mild. There has been no fever. Associated symptoms include urgency. Pertinent negatives include no chills, discharge, flank pain, frequency, hematuria, hesitancy, nausea, sweats or vomiting. Associated symptoms comments: Suprapubic discomfort. She has tried antibiotics for the symptoms. The treatment provided moderate relief. There is no history of catheterization, kidney stones, recurrent UTIs, a single kidney, urinary stasis or a urological procedure.    Review of Systems  Constitutional: Negative.  Negative for chills, fatigue, fever and unexpected weight change.  HENT: Negative for congestion, ear discharge, ear pain, rhinorrhea, sinus pressure, sneezing and sore throat.   Eyes: Negative for photophobia, pain, discharge, redness and itching.  Respiratory: Negative for cough, shortness of breath, wheezing and stridor.   Gastrointestinal: Negative for abdominal pain, blood in stool, constipation, diarrhea, nausea and vomiting.  Endocrine: Negative for cold intolerance, heat intolerance, polydipsia, polyphagia and polyuria.  Genitourinary: Positive for urgency. Negative for dysuria, flank pain, frequency, hematuria, hesitancy, menstrual problem, pelvic pain, vaginal bleeding and vaginal discharge.  Musculoskeletal: Negative for arthralgias, back pain and myalgias.  Skin: Negative for rash.  Allergic/Immunologic: Negative for environmental allergies and food allergies.  Neurological: Negative for dizziness, weakness, light-headedness, numbness and  headaches.  Hematological: Negative for adenopathy. Does not bruise/bleed easily.  Psychiatric/Behavioral: Negative for dysphoric mood. The patient is not nervous/anxious.     Patient Active Problem List   Diagnosis Date Noted  . Sebaceous cyst 07/25/2018  . Neuropathy 01/06/2016  . Essential hypertension 06/01/2015  . Hyperlipidemia 06/01/2015    Allergies  Allergen Reactions  . Iodinated Diagnostic Agents   . Penicillins     Past Surgical History:  Procedure Laterality Date  . CATARACT EXTRACTION Bilateral   . EYE SURGERY Left march 2016   cysts removed   . LITHOTRIPSY    . VAGINAL HYSTERECTOMY      Social History   Tobacco Use  . Smoking status: Former Smoker    Packs/day: 0.50    Years: 3.00    Pack years: 1.50    Types: Cigarettes    Last attempt to quit: 1989    Years since quitting: 31.3  . Smokeless tobacco: Never Used  . Tobacco comment: smoking cessation materials not required  Substance Use Topics  . Alcohol use: No    Alcohol/week: 0.0 standard drinks  . Drug use: No     Medication list has been reviewed and updated.  Current Meds  Medication Sig  . albuterol (PROVENTIL) (2.5 MG/3ML) 0.083% nebulizer solution Take 3 mLs (2.5 mg total) by nebulization every 6 (six) hours as needed for wheezing or shortness of breath.  Marland Kitchen amLODipine (NORVASC) 5 MG tablet TAKE (1) TABLET BY MOUTH EVERY DAY  . Cholecalciferol (VITAMIN D) 2000 units tablet Take by mouth.  . gabapentin (NEURONTIN) 300 MG capsule TAKE (1) CAPSULE BY MOUTH AT BEDTIME  . losartan (COZAAR) 25 MG tablet TAKE ONE TABLET BY MOUTH DAILY.  . montelukast (SINGULAIR) 10 MG tablet Take 1 tablet (10 mg total) by mouth at bedtime.  . Multiple Vitamins-Minerals (CENTRUM  SILVER PO) Take 1 capsule by mouth daily at 6 (six) AM.  . Omega-3 Fatty Acids (FISH OIL) 1000 MG CAPS Take 1 capsule (1,000 mg total) by mouth 2 (two) times daily.  . simvastatin (ZOCOR) 40 MG tablet TAKE ONE TABLET BY MOUTH AT  BEDTIME.  . vitamin B-12 (CYANOCOBALAMIN) 1000 MCG tablet Take 1,000 mcg by mouth daily.    PHQ 2/9 Scores 09/25/2018 01/13/2018 08/15/2017 02/13/2017  PHQ - 2 Score 0 0 0 0  PHQ- 9 Score 0 0 0 0    BP Readings from Last 3 Encounters:  09/25/18 130/64  07/25/18 (!) 180/78  07/16/18 138/80    Physical Exam Vitals signs and nursing note reviewed.  Constitutional:      General: She is not in acute distress.    Appearance: She is not diaphoretic.  HENT:     Head: Normocephalic and atraumatic.     Jaw: There is normal jaw occlusion.     Right Ear: Tympanic membrane, ear canal and external ear normal.     Left Ear: Tympanic membrane, ear canal and external ear normal.     Nose: Nose normal.     Mouth/Throat:     Lips: Pink.     Mouth: Mucous membranes are moist.  Eyes:     General:        Right eye: No discharge.        Left eye: No discharge.     Conjunctiva/sclera: Conjunctivae normal.     Pupils: Pupils are equal, round, and reactive to light.  Neck:     Musculoskeletal: Normal range of motion and neck supple.     Thyroid: No thyromegaly.     Vascular: No JVD.  Cardiovascular:     Rate and Rhythm: Normal rate and regular rhythm.     Heart sounds: Normal heart sounds. No murmur. No friction rub. No gallop.   Pulmonary:     Effort: Pulmonary effort is normal.     Breath sounds: Normal breath sounds.  Abdominal:     General: Bowel sounds are normal.     Palpations: Abdomen is soft. There is no hepatomegaly, splenomegaly or mass.     Tenderness: There is abdominal tenderness in the suprapubic area. There is no right CVA tenderness, left CVA tenderness or guarding.  Musculoskeletal: Normal range of motion.  Lymphadenopathy:     Cervical: No cervical adenopathy.  Skin:    General: Skin is warm and dry.  Neurological:     Mental Status: She is alert.     Deep Tendon Reflexes: Reflexes are normal and symmetric.     Wt Readings from Last 3 Encounters:  09/25/18 172 lb (78  kg)  07/25/18 172 lb 3.2 oz (78.1 kg)  07/16/18 168 lb (76.2 kg)    BP 130/64   Pulse 64   Ht 5\' 5"  (1.651 m)   Wt 172 lb (78 kg)   BMI 28.62 kg/m   Assessment and Plan: .1. Cystitis Acute.  Persistent.  Patient has had a 2 to 3-day.  Symptoms without fever or chills.  Urinalysis consistent with an acute cystitis.  Patient was started on Macrobid twice a day for 5 days. - POCT urinalysis dipstick

## 2018-10-10 ENCOUNTER — Ambulatory Visit (INDEPENDENT_AMBULATORY_CARE_PROVIDER_SITE_OTHER): Payer: Medicare Other | Admitting: Family Medicine

## 2018-10-10 ENCOUNTER — Other Ambulatory Visit: Payer: Self-pay

## 2018-10-10 ENCOUNTER — Ambulatory Visit
Admission: RE | Admit: 2018-10-10 | Discharge: 2018-10-10 | Disposition: A | Discharge status: Home / Self Care | Payer: Medicare Other | Source: Ambulatory Visit | Attending: Family Medicine | Admitting: Family Medicine

## 2018-10-10 ENCOUNTER — Ambulatory Visit
Admission: RE | Admit: 2018-10-10 | Discharge: 2018-10-10 | Disposition: A | Discharge status: Home / Self Care | Payer: Medicare Other | Attending: Family Medicine | Admitting: Family Medicine

## 2018-10-10 ENCOUNTER — Encounter: Payer: Self-pay | Admitting: Family Medicine

## 2018-10-10 VITALS — BP 138/70 | HR 80 | Ht 65.0 in | Wt 172.0 lb

## 2018-10-10 DIAGNOSIS — M79672 Pain in left foot: Secondary | ICD-10-CM | POA: Diagnosis not present

## 2018-10-10 DIAGNOSIS — M810 Age-related osteoporosis without current pathological fracture: Secondary | ICD-10-CM | POA: Diagnosis not present

## 2018-10-10 DIAGNOSIS — M7750 Other enthesopathy of unspecified foot: Secondary | ICD-10-CM

## 2018-10-10 MED ORDER — ALENDRONATE SODIUM 70 MG PO TABS: 70.0000 mg | ORAL_TABLET | ORAL | 11 refills | Status: DC

## 2018-10-10 NOTE — Patient Instructions (Signed)
Tendinitis  Tendinitis is swelling (inflammation) of a tendon. A tendon is a cord of tissue that connects muscle to bone. Tendinitis can cause pain, tenderness, and swelling. What are the causes?  Using a tendon or muscle too much (overuse). This is a common cause.  Wear and tear that happens as you age.  Injury.  Some medical conditions, such as arthritis.  Some medicines. What increases the risk? You are more likely to get this condition if you do activities that involve the same movements over and over again (repetitive motions). What are the signs or symptoms?  Pain.  Tenderness.  Mild swelling.  Decreased range of motion. How is this treated? This condition is usually treated with RICE therapy. RICE stands for:  Rest.  Ice.  Compression. This means putting pressure on the affected area.  Elevation. This means raising the affected area above the level of your heart. Treatment may also include:  Medicines for swelling or pain.  Exercises or physical therapy.  A brace or splint.  Surgery. This is rarely needed. Follow these instructions at home: If you have a splint or brace:  Wear the splint or brace as told by your doctor. Remove it only as told by your doctor.  Loosen the splint or brace if your fingers or toes: ? Tingle. ? Become numb. ? Turn cold and blue.  Keep the splint or brace clean.  If the splint or brace is not waterproof: ? Do not let it get wet. ? Cover it with a watertight covering when you take a bath or shower. Managing pain, stiffness, and swelling      If told, put ice on the affected area. ? If you have a removable splint or brace, remove it as told by your doctor. ? Put ice in a plastic bag. ? Place a towel between your skin and the bag. ? Leave the ice on for 20 minutes, 2-3 times a day.  Move the fingers or toes of the affected arm or leg often, if this applies. This helps to prevent stiffness and to lessen  swelling.  If told, raise the affected area above the level of your heart while you are sitting or lying down.  If told, put heat on the affected area before you exercise. Use the heat source that your doctor recommends, such as a moist heat pack or a heating pad. ? Place a towel between your skin and the heat source. ? Leave the heat on for 20-30 minutes. ? Remove the heat if your skin turns bright red. This is very important if you are unable to feel pain, heat, or cold. You may have a greater risk of getting burned. Driving  Do not drive or use heavy machinery while taking prescription pain medicine.  Ask your doctor when it is safe to drive if you have a splint or brace on any part of your arm or leg. Activity  Rest the affected area as told by your doctor.  Return to your normal activities as told by your doctor. Ask your doctor what activities are safe for you.  Avoid using the affected area while you have symptoms.  Do exercises as told by your doctor. General instructions  If you have a splint, do not put pressure on any part of the splint until it is fully hardened. This may take several hours.  Wear an elastic bandage or pressure (compression) wrap only as told by your doctor.  Take over-the-counter and prescription medicines only as   told by your doctor.  Keep all follow-up visits as told by your doctor. This is important. Contact a doctor if:  You do not get better.  You get new problems, such as numbness in your hands, and you do not know why. Summary  Tendinitis is swelling (inflammation) of a tendon.  You are more likely to get this condition if you do activities that involve the same movements over and over again.  This condition is usually treated with RICE therapy. RICE stands for rest, ice, compression, and elevate.  Avoid using the affected area while you have symptoms. This information is not intended to replace advice given to you by your health care provider.  Make sure you discuss any questions you have with your health care provider. Document Released: 08/24/2010 Document Revised: 10/02/2017 Document Reviewed: 10/02/2017 Elsevier Interactive Patient Education  2019 [employer].

## 2018-10-10 NOTE — Progress Notes (Signed)
Date:  10/10/2018   Name:  Traci Bowen   DOB:  Oct 07, 1940   MRN:  [id number]   Chief Complaint: Foot Pain (L) foot pain radiating up the front of the leg and last 3 digits are numb)  Foot Pain  This is a new problem. The current episode started 1 to 4 weeks ago (4-5 weeks). The problem occurs daily. The problem has been waxing and waning. Associated symptoms include joint swelling and numbness. Pertinent negatives include no abdominal pain, anorexia, arthralgias, change in bowel habit, chest pain, chills, congestion, coughing, diaphoresis, fatigue, fever, headaches, myalgias, nausea, neck pain, rash, sore throat, swollen glands, urinary symptoms, vertigo, visual change, vomiting or weakness. The symptoms are aggravated by walking and exertion. She has tried acetaminophen (ace wrap) for the symptoms. The treatment provided mild relief.  Neurologic Problem  The patient's primary symptoms include focal sensory loss. The patient's pertinent negatives include no altered mental status, clumsiness, focal weakness, loss of balance, memory loss, near-syncope, slurred speech, syncope, visual change or weakness. Primary symptoms comment: burning sensation lateral aspect left foot/numbness lateral aspect. This is a new problem. The current episode started 1 to 4 weeks ago. There was left-sided and lower extremity focality noted. Pertinent negatives include no abdominal pain, back pain, chest pain, diaphoresis, dizziness, fatigue, fever, headaches, light-headedness, nausea, neck pain, shortness of breath, vertigo or vomiting. The treatment provided moderate relief.    Review of Systems  Constitutional: Negative.  Negative for chills, diaphoresis, fatigue, fever and unexpected weight change.  HENT: Negative for congestion, ear discharge, ear pain, rhinorrhea, sinus pressure, sneezing and sore throat.   Eyes: Negative for photophobia, pain, discharge, redness and itching.  Respiratory: Negative for  cough, shortness of breath, wheezing and stridor.   Cardiovascular: Negative for chest pain and near-syncope.  Gastrointestinal: Negative for abdominal pain, anorexia, blood in stool, change in bowel habit, constipation, diarrhea, nausea and vomiting.  Endocrine: Negative for cold intolerance, heat intolerance, polydipsia, polyphagia and polyuria.  Genitourinary: Negative for dysuria, flank pain, frequency, hematuria, menstrual problem, pelvic pain, urgency, vaginal bleeding and vaginal discharge.  Musculoskeletal: Positive for joint swelling. Negative for arthralgias, back pain, myalgias and neck pain.  Skin: Negative for rash.  Allergic/Immunologic: Negative for environmental allergies and food allergies.  Neurological: Positive for numbness. Negative for dizziness, vertigo, focal weakness, syncope, weakness, light-headedness, headaches and loss of balance.  Hematological: Negative for adenopathy. Does not bruise/bleed easily.  Psychiatric/Behavioral: Negative for dysphoric mood and memory loss. The patient is not nervous/anxious.     Patient Active Problem List   Diagnosis Date Noted  . Sebaceous cyst 07/25/2018  . Neuropathy 01/06/2016  . Essential hypertension 06/01/2015  . Hyperlipidemia 06/01/2015    Allergies  Allergen Reactions  . Iodinated Diagnostic Agents   . Penicillins     Past Surgical History:  Procedure Laterality Date  . CATARACT EXTRACTION Bilateral   . EYE SURGERY Left march 2016   cysts removed   . LITHOTRIPSY    . VAGINAL HYSTERECTOMY      Social History   Tobacco Use  . Smoking status: Former Smoker    Packs/day: 0.50    Years: 3.00    Pack years: 1.50    Types: Cigarettes    Last attempt to quit: 1989    Years since quitting: 31.3  . Smokeless tobacco: Never Used  . Tobacco comment: smoking cessation materials not required  Substance Use Topics  . Alcohol use: No    Alcohol/week: 0.0 standard drinks  .  Drug use: No     Medication list has  been reviewed and updated.  Current Meds  Medication Sig  . albuterol (PROVENTIL) (2.5 MG/3ML) 0.083% nebulizer solution Take 3 mLs (2.5 mg total) by nebulization every 6 (six) hours as needed for wheezing or shortness of breath.  Marland Kitchen amLODipine (NORVASC) 5 MG tablet TAKE (1) TABLET BY MOUTH EVERY DAY  . Cholecalciferol (VITAMIN D) 2000 units tablet Take by mouth.  . gabapentin (NEURONTIN) 300 MG capsule TAKE (1) CAPSULE BY MOUTH AT BEDTIME  . losartan (COZAAR) 25 MG tablet TAKE ONE TABLET BY MOUTH DAILY.  . montelukast (SINGULAIR) 10 MG tablet Take 1 tablet (10 mg total) by mouth at bedtime.  . Multiple Vitamins-Minerals (CENTRUM SILVER PO) Take 1 capsule by mouth daily at 6 (six) AM.  . Omega-3 Fatty Acids (FISH OIL) 1000 MG CAPS Take 1 capsule (1,000 mg total) by mouth 2 (two) times daily.  . simvastatin (ZOCOR) 40 MG tablet TAKE ONE TABLET BY MOUTH AT BEDTIME.  . vitamin B-12 (CYANOCOBALAMIN) 1000 MCG tablet Take 1,000 mcg by mouth daily.    PHQ 2/9 Scores 09/25/2018 01/13/2018 08/15/2017 02/13/2017  PHQ - 2 Score 0 0 0 0  PHQ- 9 Score 0 0 0 0    BP Readings from Last 3 Encounters:  10/10/18 138/70  09/25/18 130/64  07/25/18 (!) 180/78    Physical Exam Vitals signs and nursing note reviewed.  Constitutional:      General: She is not in acute distress.    Appearance: She is not diaphoretic.  HENT:     Head: Normocephalic and atraumatic.     Right Ear: Tympanic membrane, ear canal and external ear normal.     Left Ear: Tympanic membrane, ear canal and external ear normal.     Nose: Nose normal. No congestion or rhinorrhea.     Mouth/Throat:     Mouth: Mucous membranes are moist.     Pharynx: Oropharynx is clear.  Eyes:     General:        Right eye: No discharge.        Left eye: No discharge.     Conjunctiva/sclera: Conjunctivae normal.     Pupils: Pupils are equal, round, and reactive to light.  Neck:     Musculoskeletal: Normal range of motion and neck supple.      Thyroid: No thyromegaly.     Vascular: No JVD.  Cardiovascular:     Rate and Rhythm: Normal rate and regular rhythm.     Heart sounds: Normal heart sounds. No murmur. No friction rub. No gallop.   Pulmonary:     Effort: Pulmonary effort is normal.     Breath sounds: Normal breath sounds. No wheezing, rhonchi or rales.  Chest:     Chest wall: No tenderness.  Abdominal:     General: Bowel sounds are normal.     Palpations: Abdomen is soft. There is no mass.     Tenderness: There is no abdominal tenderness. There is no guarding or rebound.  Musculoskeletal: Normal range of motion.     Right foot: Normal range of motion. No deformity.     Left foot: Normal range of motion. No deformity.  Feet:     Right foot:     Skin integrity: Skin integrity normal. No ulcer, blister, skin breakdown, erythema, warmth, callus, dry skin or fissure.     Left foot:     Skin integrity: Skin integrity normal. No ulcer, blister, skin breakdown, erythema, warmth,  callus, dry skin or fissure.     Comments: Ecchymosis/tenderness/ with passive toe doriflex Lymphadenopathy:     Cervical: No cervical adenopathy.  Skin:    General: Skin is warm and dry.  Neurological:     Mental Status: She is alert.     Deep Tendon Reflexes: Reflexes are normal and symmetric.     Wt Readings from Last 3 Encounters:  10/10/18 172 lb (78 kg)  09/25/18 172 lb (78 kg)  07/25/18 172 lb 3.2 oz (78.1 kg)    BP 138/70   Pulse 80   Ht 5\' 5"  (1.651 m)   Wt 172 lb (78 kg)   BMI 28.62 kg/m   Assessment and Plan: .1. Age related osteoporosis, unspecified pathological fracture presence Previously noted on DEXA scan.  Have been treating with calcium and vitamin D.  Given that she has had some increased pain of her foot with the possibility of an occult wrist fracture we will initiate Fosamax 70 mg once a week for past 6 months to a year.  Only. - alendronate (FOSAMAX) 70 MG tablet; Take 1 tablet (70 mg total) by mouth every 7  (seven) days. Take with a full glass of water on an empty stomach.  Dispense: 4 tablet; Refill: 11  2. Tendonitis of ankle or foot New onset pain dorsum of the foot.  Exam and ecchymosis on the dorsum of the left foot suggestive of a possible tendinitis.  Never we will do an x-ray. - DG Foot Complete Left; Future  3. Foot pain, left Patient had sudden onset of foot pain.  Received a fit bit for Christmas but she will not wear it.  It does not appear that she is increased her exercise and walking.  Will x-ray to rule out possible stress fracture. - DG Foot Complete Left; Future

## 2018-10-13 ENCOUNTER — Encounter: Payer: Self-pay | Admitting: Family Medicine

## 2018-10-13 ENCOUNTER — Other Ambulatory Visit: Payer: Self-pay

## 2018-10-13 DIAGNOSIS — M779 Enthesopathy, unspecified: Secondary | ICD-10-CM

## 2018-10-13 MED ORDER — MELOXICAM 7.5 MG PO TABS: 7.5000 mg | ORAL_TABLET | Freq: Every day | ORAL | 0 refills | Status: DC

## 2018-10-13 NOTE — Progress Notes (Unsigned)
Sent in meloxicam

## 2018-12-15 ENCOUNTER — Other Ambulatory Visit: Payer: Self-pay

## 2018-12-15 DIAGNOSIS — M779 Enthesopathy, unspecified: Secondary | ICD-10-CM

## 2018-12-15 DIAGNOSIS — M79672 Pain in left foot: Secondary | ICD-10-CM

## 2018-12-15 NOTE — Progress Notes (Unsigned)
Ref pod put in

## 2018-12-22 ENCOUNTER — Other Ambulatory Visit: Payer: Self-pay | Admitting: Family Medicine

## 2018-12-22 DIAGNOSIS — M48061 Spinal stenosis, lumbar region without neurogenic claudication: Secondary | ICD-10-CM

## 2018-12-24 DIAGNOSIS — M25572 Pain in left ankle and joints of left foot: Secondary | ICD-10-CM | POA: Diagnosis not present

## 2018-12-24 DIAGNOSIS — M65872 Other synovitis and tenosynovitis, left ankle and foot: Secondary | ICD-10-CM | POA: Diagnosis not present

## 2019-01-05 ENCOUNTER — Other Ambulatory Visit: Payer: Self-pay

## 2019-01-05 ENCOUNTER — Ambulatory Visit (INDEPENDENT_AMBULATORY_CARE_PROVIDER_SITE_OTHER): Payer: Medicare Other

## 2019-01-05 VITALS — BP 150/78 | HR 58 | Temp 97.9°F | Resp 16 | Ht 65.0 in | Wt 168.4 lb

## 2019-01-05 DIAGNOSIS — Z0000 Encounter for general adult medical examination without abnormal findings: Secondary | ICD-10-CM | POA: Diagnosis not present

## 2019-01-05 NOTE — Patient Instructions (Signed)
Traci Bowen , Thank you for taking time to come for your Medicare Wellness Visit. I appreciate your ongoing commitment to your health goals. Please review the following plan we discussed and let me know if I can assist you in the future.   Screening recommendations/referrals: Colonoscopy: no longer required Mammogram: postponed Bone Density: done 04/15/17 Recommended yearly ophthalmology/optometry visit for glaucoma screening and checkup Recommended yearly dental visit for hygiene and checkup  Vaccinations: Influenza vaccine: done 04/01/18 Pneumococcal vaccine: done 08/15/17 Tdap vaccine: done 04/30/16 Shingles vaccine: Shingrix discussed. Please contact your pharmacy for coverage information.   Advanced directives: Please bring a copy of your health care power of attorney and living will to the office at your convenience.  Conditions/risks identified: Recommend increasing physical activity to at least 3 days per week  Next appointment: Please follow up in one year for your Medicare Annual Wellness visit.     Preventive Care 53 Years and Older, Female Preventive care refers to lifestyle choices and visits with your health care provider that can promote health and wellness. What does preventive care include?  A yearly physical exam. This is also called an annual well check.  Dental exams once or twice a year.  Routine eye exams. Ask your health care provider how often you should have your eyes checked.  Personal lifestyle choices, including:  Daily care of your teeth and gums.  Regular physical activity.  Eating a healthy diet.  Avoiding tobacco and drug use.  Limiting alcohol use.  Practicing safe sex.  Taking low-dose aspirin every day.  Taking vitamin and mineral supplements as recommended by your health care provider. What happens during an annual well check? The services and screenings done by your health care provider during your annual well check will  depend on your age, overall health, lifestyle risk factors, and family history of disease. Counseling  Your health care provider may ask you questions about your:  Alcohol use.  Tobacco use.  Drug use.  Emotional well-being.  Home and relationship well-being.  Sexual activity.  Eating habits.  History of falls.  Memory and ability to understand (cognition).  Work and work Statistician.  Reproductive health. Screening  You may have the following tests or measurements:  Height, weight, and BMI.  Blood pressure.  Lipid and cholesterol levels. These may be checked every 5 years, or more frequently if you are over 82 years old.  Skin check.  Lung cancer screening. You may have this screening every year starting at age 22 if you have a 30-pack-year history of smoking and currently smoke or have quit within the past 15 years.  Fecal occult blood test (FOBT) of the stool. You may have this test every year starting at age 68.  Flexible sigmoidoscopy or colonoscopy. You may have a sigmoidoscopy every 5 years or a colonoscopy every 10 years starting at age 88.  Hepatitis C blood test.  Hepatitis B blood test.  Sexually transmitted disease (STD) testing.  Diabetes screening. This is done by checking your blood sugar (glucose) after you have not eaten for a while (fasting). You may have this done every 1-3 years.  Bone density scan. This is done to screen for osteoporosis. You may have this done starting at age 82.  Mammogram. This may be done every 1-2 years. Talk to your health care provider about how often you should have regular mammograms. Talk with your health care provider about your test results, treatment options, and if necessary, the need for more  tests. Vaccines  Your health care provider may recommend certain vaccines, such as:  Influenza vaccine. This is recommended every year.  Tetanus, diphtheria, and acellular pertussis (Tdap, Td) vaccine. You may need a  Td booster every 10 years.  Zoster vaccine. You may need this after age 20.  Pneumococcal 13-valent conjugate (PCV13) vaccine. One dose is recommended after age 13.  Pneumococcal polysaccharide (PPSV23) vaccine. One dose is recommended after age 72. Talk to your health care provider about which screenings and vaccines you need and how often you need them. This information is not intended to replace advice given to you by your health care provider. Make sure you discuss any questions you have with your health care provider. Document Released: 06/10/2015 Document Revised: 02/01/2016 Document Reviewed: 03/15/2015 Elsevier Interactive Patient Education  2017 [place] Prevention in the Home Falls can cause injuries. They can happen to people of all ages. There are many things you can do to make your home safe and to help prevent falls. What can I do on the outside of my home?  Regularly fix the edges of walkways and driveways and fix any cracks.  Remove anything that might make you trip as you walk through a door, such as a raised step or threshold.  Trim any bushes or trees on the path to your home.  Use bright outdoor lighting.  Clear any walking paths of anything that might make someone trip, such as rocks or tools.  Regularly check to see if handrails are loose or broken. Make sure that both sides of any steps have handrails.  Any raised decks and porches should have guardrails on the edges.  Have any leaves, snow, or ice cleared regularly.  Use sand or salt on walking paths during winter.  Clean up any spills in your garage right away. This includes oil or grease spills. What can I do in the bathroom?  Use night lights.  Install grab bars by the toilet and in the tub and shower. Do not use towel bars as grab bars.  Use non-skid mats or decals in the tub or shower.  If you need to sit down in the shower, use a plastic, non-slip stool.  Keep the floor dry. Clean  up any water that spills on the floor as soon as it happens.  Remove soap buildup in the tub or shower regularly.  Attach bath mats securely with double-sided non-slip rug tape.  Do not have throw rugs and other things on the floor that can make you trip. What can I do in the bedroom?  Use night lights.  Make sure that you have a light by your bed that is easy to reach.  Do not use any sheets or blankets that are too big for your bed. They should not hang down onto the floor.  Have a firm chair that has side arms. You can use this for support while you get dressed.  Do not have throw rugs and other things on the floor that can make you trip. What can I do in the kitchen?  Clean up any spills right away.  Avoid walking on wet floors.  Keep items that you use a lot in easy-to-reach places.  If you need to reach something above you, use a strong step stool that has a grab bar.  Keep electrical cords out of the way.  Do not use floor polish or wax that makes floors slippery. If you must use wax, use non-skid floor  wax.  Do not have throw rugs and other things on the floor that can make you trip. What can I do with my stairs?  Do not leave any items on the stairs.  Make sure that there are handrails on both sides of the stairs and use them. Fix handrails that are broken or loose. Make sure that handrails are as long as the stairways.  Check any carpeting to make sure that it is firmly attached to the stairs. Fix any carpet that is loose or worn.  Avoid having throw rugs at the top or bottom of the stairs. If you do have throw rugs, attach them to the floor with carpet tape.  Make sure that you have a light switch at the top of the stairs and the bottom of the stairs. If you do not have them, ask someone to add them for you. What else can I do to help prevent falls?  Wear shoes that:  Do not have high heels.  Have rubber bottoms.  Are comfortable and fit you well.  Are  closed at the toe. Do not wear sandals.  If you use a stepladder:  Make sure that it is fully opened. Do not climb a closed stepladder.  Make sure that both sides of the stepladder are locked into place.  Ask someone to hold it for you, if possible.  Clearly mark and make sure that you can see:  Any grab bars or handrails.  First and last steps.  Where the edge of each step is.  Use tools that help you move around (mobility aids) if they are needed. These include:  Canes.  Walkers.  Scooters.  Crutches.  Turn on the lights when you go into a dark area. Replace any light bulbs as soon as they burn out.  Set up your furniture so you have a clear path. Avoid moving your furniture around.  If any of your floors are uneven, fix them.  If there are any pets around you, be aware of where they are.  Review your medicines with your doctor. Some medicines can make you feel dizzy. This can increase your chance of falling. Ask your doctor what other things that you can do to help prevent falls. This information is not intended to replace advice given to you by your health care provider. Make sure you discuss any questions you have with your health care provider. Document Released: 03/10/2009 Document Revised: 10/20/2015 Document Reviewed: 06/18/2014 Elsevier Interactive Patient Education  2017 [employer].

## 2019-01-05 NOTE — Progress Notes (Signed)
Subjective:   Traci Bowen is a 78 y.o. female who presents for Medicare Annual (Subsequent) preventive examination.  Review of Systems:   Cardiac Risk Factors include: advanced age (>39men, >24 women);hypertension;dyslipidemia     Objective:     Vitals: BP (!) 150/78 (BP Location: Left Arm, Patient Position: Sitting, Cuff Size: Normal)   Pulse (!) 58   Temp 97.9 F (36.6 C) (Oral)   Resp 16   Ht 5\' 5"  (1.651 m)   Wt 168 lb 6.4 oz (76.4 kg)   SpO2 98%   BMI 28.02 kg/m   Body mass index is 28.02 kg/m.  Advanced Directives 01/05/2019 08/15/2017 06/14/2017 02/10/2015  Does Patient Have a Medical Advance Directive? Yes Yes Yes No;Yes  Type of Paramedic of [place];Living will Healthcare Power of Attorney Living will  Copy of [place] in Chart? No - copy requested No - copy requested No - copy requested -  Would patient like information on creating a medical advance directive? - - - No - patient declined information    Tobacco Social History   Tobacco Use  Smoking Status Former Smoker  . Packs/day: 0.50  . Years: 3.00  . Pack years: 1.50  . Types: Cigarettes  . Quit date: 29  . Years since quitting: 31.6  Smokeless Tobacco Never Used  Tobacco Comment   smoking cessation materials not required     Counseling given: Not Answered Comment: smoking cessation materials not required   Clinical Intake:  Pre-visit preparation completed: Yes  Pain : No/denies pain     BMI - recorded: 28.02 Nutritional Status: BMI 25 -29 Overweight Nutritional Risks: None Diabetes: No  How often do you need to have someone help you when you read instructions, pamphlets, or other written materials from your doctor or pharmacy?: 1 - Never  Interpreter Needed?: No  Information entered by :: Clemetine Marker LPN  Past Medical History:  Diagnosis Date  . Hyperlipidemia   . Hypertension   . Neuropathy   .  Renal disorder   . Spinal stenosis    Past Surgical History:  Procedure Laterality Date  . CATARACT EXTRACTION Bilateral   . EYE SURGERY Left march 2016   cysts removed   . LITHOTRIPSY    . VAGINAL HYSTERECTOMY     Family History  Problem Relation Age of Onset  . Diabetes Mother   . Hypertension Mother   . Cancer Father   . Cancer Brother   . Stroke Sister   . Heart attack Brother    Social History   Socioeconomic History  . Marital status: Married    Spouse name: Not on file  . Number of children: 2  . Years of education: 1 year college  . Highest education level: 12th grade  Occupational History  . Occupation: Retired  Scientific laboratory technician  . Financial resource strain: Not hard at all  . Food insecurity    Worry: Never true    Inability: Never true  . Transportation needs    Medical: No    Non-medical: No  Tobacco Use  . Smoking status: Former Smoker    Packs/day: 0.50    Years: 3.00    Pack years: 1.50    Types: Cigarettes    Quit date: 1989    Years since quitting: 31.6  . Smokeless tobacco: Never Used  . Tobacco comment: smoking cessation materials not required  Substance and Sexual Activity  . Alcohol use:  No    Alcohol/week: 0.0 standard drinks  . Drug use: No  . Sexual activity: Never  Lifestyle  . Physical activity    Days per week: 0 days    Minutes per session: 0 min  . Stress: Not at all  Relationships  . Social Herbalist on phone: Patient refused    Gets together: Patient refused    Attends religious service: Patient refused    Active member of club or organization: Patient refused    Attends meetings of clubs or organizations: Patient refused    Relationship status: Married  Other Topics Concern  . Not on file  Social History Narrative  . Not on file    Outpatient Encounter Medications as of 01/05/2019  Medication Sig  . amLODipine (NORVASC) 5 MG tablet TAKE (1) TABLET BY MOUTH EVERY DAY  . Cholecalciferol (VITAMIN D) 2000  units tablet Take by mouth.  . gabapentin (NEURONTIN) 300 MG capsule TAKE (1) CAPSULE BY MOUTH AT BEDTIME  . losartan (COZAAR) 25 MG tablet TAKE ONE TABLET BY MOUTH DAILY.  . Multiple Vitamins-Minerals (CENTRUM SILVER PO) Take 1 capsule by mouth daily at 6 (six) AM.  . Omega-3 Fatty Acids (FISH OIL) 1000 MG CAPS Take 1 capsule (1,000 mg total) by mouth 2 (two) times daily.  . simvastatin (ZOCOR) 40 MG tablet TAKE ONE TABLET BY MOUTH AT BEDTIME.  . vitamin B-12 (CYANOCOBALAMIN) 1000 MCG tablet Take 1,000 mcg by mouth daily.  Marland Kitchen alendronate (FOSAMAX) 70 MG tablet Take 1 tablet (70 mg total) by mouth every 7 (seven) days. Take with a full glass of water on an empty stomach. (Patient not taking: Reported on 01/05/2019)  . meloxicam (MOBIC) 7.5 MG tablet Take 1 tablet (7.5 mg total) by mouth daily. (Patient not taking: Reported on 01/05/2019)  . montelukast (SINGULAIR) 10 MG tablet Take 1 tablet (10 mg total) by mouth at bedtime. (Patient not taking: Reported on 01/05/2019)  . [DISCONTINUED] albuterol (PROVENTIL) (2.5 MG/3ML) 0.083% nebulizer solution Take 3 mLs (2.5 mg total) by nebulization every 6 (six) hours as needed for wheezing or shortness of breath.   No facility-administered encounter medications on file as of 01/05/2019.     Activities of Daily Living In your present state of health, do you have any difficulty performing the following activities: 01/05/2019  Hearing? Y  Comment wears hearing aids  Vision? N  Difficulty concentrating or making decisions? N  Walking or climbing stairs? N  Dressing or bathing? N  Doing errands, shopping? N  Preparing Food and eating ? N  Using the Toilet? N  In the past six months, have you accidently leaked urine? Y  Comment wears pads for protection  Do you have problems with loss of bowel control? N  Managing your Medications? N  Managing your Finances? N  Housekeeping or managing your Housekeeping? N  Some recent data might be hidden    Patient  Care Team: Juline Patch, MD as PCP - General (Family Medicine)    Assessment:   This is a routine wellness examination for [place].  Exercise Activities and Dietary recommendations Current Exercise Habits: The patient does not participate in regular exercise at present, Exercise limited by: None identified  Goals    . DIET - INCREASE WATER INTAKE     Recommend to drink at least 6-8 8oz glasses of water per day.    . Increase physical activity     Recommend increasing physical activity to at least 3 days  per week       Fall Risk Fall Risk  01/05/2019 07/25/2018 08/15/2017 02/13/2017 08/31/2015  Falls in the past year? 1 0 Yes No No  Comment - - syncope, b/p meds changed - -  Number falls in past yr: 1 0 1 - -  Comment tripped in the yard - - - -  Injury with Fall? 0 0 No - -  Risk for fall due to : - - Medication side effect - -  Follow up Falls prevention discussed - Falls evaluation completed;Education provided;Falls prevention discussed - -   FALL RISK PREVENTION PERTAINING TO THE HOME:  Any stairs in or around the home? No  If so, do they handrails? No   Home free of loose throw rugs in walkways, pet beds, electrical cords, etc? Yes  Adequate lighting in your home to reduce risk of falls? Yes   ASSISTIVE DEVICES UTILIZED TO PREVENT FALLS:  Life alert? No  Use of a cane, walker or w/c? No  Grab bars in the bathroom? Yes  Shower chair or bench in shower? Yes  Elevated toilet seat or a handicapped toilet? No   DME ORDERS:  DME order needed?  No   TIMED UP AND GO:  Was the test performed? Yes .  Length of time to ambulate 10 feet: 5 sec.   GAIT:  Appearance of gait: Gait stead-fast and without the use of an assistive device.   Education: Fall risk prevention has been discussed.  Intervention(s) required? No    Depression Screen PHQ 2/9 Scores 01/05/2019 09/25/2018 01/13/2018 08/15/2017  PHQ - 2 Score 0 0 0 0  PHQ- 9 Score - 0 0 0     Cognitive Function - pt  declines 6CIT for 2020 AWV     6CIT Screen 08/15/2017  What Year? 0 points  What month? 0 points  What time? 0 points  Count back from 20 0 points  Months in reverse 0 points  Repeat phrase 2 points  Total Score 2    Immunization History  Administered Date(s) Administered  . Influenza,inj,Quad PF,6+ Mos 02/13/2017  . Influenza-Unspecified 03/16/2016  . Pneumococcal Conjugate-13 04/30/2016  . Pneumococcal Polysaccharide-23 08/15/2017  . Tdap 04/30/2016    Qualifies for Shingles Vaccine? Yes . Due for Shingrix. Education has been provided regarding the importance of this vaccine. Pt has been advised to call insurance company to determine out of pocket expense. Advised may also receive vaccine at local pharmacy or Health Dept. Verbalized acceptance and understanding.  Tdap: Up to date  Flu Vaccine: Up to date  Pneumococcal Vaccine: Up to date   Screening Tests Health Maintenance  Topic Date Due  . INFLUENZA VACCINE  12/27/2018  . TETANUS/TDAP  04/30/2026  . DEXA SCAN  Completed  . PNA vac Low Risk Adult  Completed    Cancer Screenings:  Colorectal Screening: Not completed. No longer required.   Mammogram: pt declines this screening  Bone Density: Completed 04/15/17. Results reflect OSTEOPOROSIS. Repeat every 2 years.   Lung Cancer Screening: (Low Dose CT Chest recommended if Age 108-80 years, 30 pack-year currently smoking OR have quit w/in 15years.) does not qualify.   Additional Screening:  Hepatitis C Screening: no longer required  Vision Screening: Recommended annual ophthalmology exams for early detection of glaucoma and other disorders of the eye. Is the patient up to date with their annual eye exam?  Yes  Who is the provider or what is the name of the office in which the pt  attends annual eye exams? Dr. Nicole Kindred in [place] Screening: Recommended annual dental exams for proper oral hygiene  Community Resource Referral:  CRR required this  visit?  No      Plan:     I have personally reviewed and addressed the Medicare Annual Wellness questionnaire and have noted the following in the patient's chart:  A. Medical and social history B. Use of alcohol, tobacco or illicit drugs  C. Current medications and supplements D. Functional ability and status E.  Nutritional status F.  Physical activity G. Advance directives H. List of other physicians I.  Hospitalizations, surgeries, and ER visits in previous 12 months J.  [place] such as hearing and vision if needed, cognitive and depression L. Referrals and appointments   In addition, I have reviewed and discussed with patient certain preventive protocols, quality metrics, and best practice recommendations. A written personalized care plan for preventive services as well as general preventive health recommendations were provided to patient.   Signed,  Clemetine Marker, LPN Nurse Health Advisor   Nurse Notes: pt doing well and appreciative of visit today. Systolic BP slightly elevated, pt has f/u appt scheduled for 01/20/19

## 2019-01-14 DIAGNOSIS — M65872 Other synovitis and tenosynovitis, left ankle and foot: Secondary | ICD-10-CM | POA: Diagnosis not present

## 2019-01-14 DIAGNOSIS — M25572 Pain in left ankle and joints of left foot: Secondary | ICD-10-CM | POA: Diagnosis not present

## 2019-01-20 ENCOUNTER — Ambulatory Visit (INDEPENDENT_AMBULATORY_CARE_PROVIDER_SITE_OTHER): Payer: Medicare Other | Admitting: Family Medicine

## 2019-01-20 ENCOUNTER — Encounter: Payer: Self-pay | Admitting: Family Medicine

## 2019-01-20 ENCOUNTER — Other Ambulatory Visit: Payer: Self-pay

## 2019-01-20 VITALS — BP 130/70 | HR 60 | Ht 65.0 in | Wt 172.0 lb

## 2019-01-20 DIAGNOSIS — M48061 Spinal stenosis, lumbar region without neurogenic claudication: Secondary | ICD-10-CM

## 2019-01-20 DIAGNOSIS — G2581 Restless legs syndrome: Secondary | ICD-10-CM | POA: Diagnosis not present

## 2019-01-20 DIAGNOSIS — I10 Essential (primary) hypertension: Secondary | ICD-10-CM

## 2019-01-20 DIAGNOSIS — J4520 Mild intermittent asthma, uncomplicated: Secondary | ICD-10-CM

## 2019-01-20 DIAGNOSIS — E782 Mixed hyperlipidemia: Secondary | ICD-10-CM

## 2019-01-20 DIAGNOSIS — Z23 Encounter for immunization: Secondary | ICD-10-CM | POA: Diagnosis not present

## 2019-01-20 DIAGNOSIS — G629 Polyneuropathy, unspecified: Secondary | ICD-10-CM | POA: Diagnosis not present

## 2019-01-20 DIAGNOSIS — M810 Age-related osteoporosis without current pathological fracture: Secondary | ICD-10-CM

## 2019-01-20 MED ORDER — GABAPENTIN 300 MG PO CAPS: ORAL_CAPSULE | ORAL | 1 refills | Status: DC

## 2019-01-20 MED ORDER — SIMVASTATIN 40 MG PO TABS: 40.0000 mg | ORAL_TABLET | Freq: Every day | ORAL | 1 refills | Status: DC

## 2019-01-20 MED ORDER — ALBUTEROL SULFATE HFA 108 (90 BASE) MCG/ACT IN AERS: 2.0000 | INHALATION_SPRAY | Freq: Four times a day (QID) | RESPIRATORY_TRACT | 1 refills | Status: DC | PRN

## 2019-01-20 MED ORDER — AMLODIPINE BESYLATE 5 MG PO TABS: ORAL_TABLET | ORAL | 1 refills | Status: DC

## 2019-01-20 MED ORDER — LOSARTAN POTASSIUM 25 MG PO TABS: 25.0000 mg | ORAL_TABLET | Freq: Every day | ORAL | 1 refills | Status: DC

## 2019-01-20 NOTE — Progress Notes (Signed)
Date:  01/20/2019   Name:  Traci Bowen   DOB:  04-23-1941   MRN:  [id number]   Chief Complaint: Hypertension, Hyperlipidemia, and restless leg  Hypertension This is a chronic problem. The current episode started more than 1 year ago. The problem is unchanged. The problem is controlled. Pertinent negatives include no anxiety, blurred vision, chest pain, headaches, malaise/fatigue, neck pain, orthopnea, palpitations, peripheral edema, PND, shortness of breath or sweats. There are no associated agents to hypertension. Risk factors for coronary artery disease include dyslipidemia. Past treatments include angiotensin blockers and calcium channel blockers. The current treatment provides moderate improvement. There are no compliance problems.  There is no history of angina, kidney disease, CAD/MI, CVA, heart failure, left ventricular hypertrophy, PVD or retinopathy. There is no history of chronic renal disease, a hypertension causing med or renovascular disease.  Hyperlipidemia This is a chronic problem. The current episode started more than 1 year ago. The problem is controlled. Recent lipid tests were reviewed and are normal. She has no history of chronic renal disease, diabetes, hypothyroidism or obesity. Pertinent negatives include no chest pain, focal sensory loss, focal weakness, myalgias or shortness of breath. Current antihyperlipidemic treatment includes statins. The current treatment provides moderate improvement of lipids. There are no compliance problems.  Risk factors for coronary artery disease include dyslipidemia.  Neurologic Problem The patient's pertinent negatives include no altered mental status, clumsiness, focal sensory loss, focal weakness, loss of balance, memory loss, near-syncope, slurred speech, syncope, visual change or weakness. Primary symptoms comment: neuropathy/restless leg. This is a chronic problem. The current episode started more than 1 year ago. The neurological  problem developed suddenly. The problem has been waxing and waning since onset. There was lower extremity focality noted. Pertinent negatives include no abdominal pain, back pain, chest pain, dizziness, fatigue, fever, headaches, light-headedness, nausea, neck pain, palpitations, shortness of breath or vomiting. Treatments tried: gabapentin. The treatment provided moderate relief.  Asthma She complains of wheezing. There is no chest tightness, cough, difficulty breathing, frequent throat clearing, hemoptysis, hoarse voice or shortness of breath. This is a recurrent problem. The problem occurs daily. The problem has been waxing and waning. Pertinent negatives include no chest pain, dyspnea on exertion, ear congestion, ear pain, fever, headaches, malaise/fatigue, myalgias, PND, rhinorrhea, sneezing, sore throat or sweats. Her symptoms are aggravated by change in weather. Her symptoms are alleviated by beta-agonist. She reports moderate improvement on treatment. Her past medical history is significant for asthma.    Review of Systems  Constitutional: Negative.  Negative for chills, fatigue, fever, malaise/fatigue and unexpected weight change.  HENT: Negative for congestion, ear discharge, ear pain, hoarse voice, rhinorrhea, sinus pressure, sneezing and sore throat.   Eyes: Negative for blurred vision, photophobia, pain, discharge, redness and itching.  Respiratory: Positive for wheezing. Negative for cough, hemoptysis, shortness of breath and stridor.   Cardiovascular: Negative for chest pain, dyspnea on exertion, palpitations, orthopnea, PND and near-syncope.  Gastrointestinal: Negative for abdominal pain, blood in stool, constipation, diarrhea, nausea and vomiting.  Endocrine: Negative for cold intolerance, heat intolerance, polydipsia, polyphagia and polyuria.  Genitourinary: Negative for dysuria, flank pain, frequency, hematuria, menstrual problem, pelvic pain, urgency, vaginal bleeding and vaginal  discharge.  Musculoskeletal: Negative for arthralgias, back pain, myalgias and neck pain.  Skin: Negative for rash.  Allergic/Immunologic: Negative for environmental allergies and food allergies.  Neurological: Negative for dizziness, focal weakness, syncope, weakness, light-headedness, numbness, headaches and loss of balance.  Hematological: Negative for adenopathy. Does not bruise/bleed  easily.  Psychiatric/Behavioral: Negative for dysphoric mood and memory loss. The patient is not nervous/anxious.     Patient Active Problem List   Diagnosis Date Noted  . Sebaceous cyst 07/25/2018  . Neuropathy 01/06/2016  . Essential hypertension 06/01/2015  . Hyperlipidemia 06/01/2015    Allergies  Allergen Reactions  . Iodinated Diagnostic Agents   . Penicillins     Past Surgical History:  Procedure Laterality Date  . CATARACT EXTRACTION Bilateral   . EYE SURGERY Left march 2016   cysts removed   . LITHOTRIPSY    . VAGINAL HYSTERECTOMY      Social History   Tobacco Use  . Smoking status: Former Smoker    Packs/day: 0.50    Years: 3.00    Pack years: 1.50    Types: Cigarettes    Quit date: 1989    Years since quitting: 31.6  . Smokeless tobacco: Never Used  . Tobacco comment: smoking cessation materials not required  Substance Use Topics  . Alcohol use: No    Alcohol/week: 0.0 standard drinks  . Drug use: No     Medication list has been reviewed and updated.  Current Meds  Medication Sig  . amLODipine (NORVASC) 5 MG tablet TAKE (1) TABLET BY MOUTH EVERY DAY  . Cholecalciferol (VITAMIN D) 2000 units tablet Take by mouth.  . gabapentin (NEURONTIN) 300 MG capsule TAKE (1) CAPSULE BY MOUTH AT BEDTIME  . losartan (COZAAR) 25 MG tablet TAKE ONE TABLET BY MOUTH DAILY.  . Multiple Vitamins-Minerals (CENTRUM SILVER PO) Take 1 capsule by mouth daily at 6 (six) AM.  . Omega-3 Fatty Acids (FISH OIL) 1000 MG CAPS Take 1 capsule (1,000 mg total) by mouth 2 (two) times daily.  .  simvastatin (ZOCOR) 40 MG tablet TAKE ONE TABLET BY MOUTH AT BEDTIME.  . vitamin B-12 (CYANOCOBALAMIN) 1000 MCG tablet Take 1,000 mcg by mouth daily.  . [DISCONTINUED] meloxicam (MOBIC) 7.5 MG tablet Take 1 tablet (7.5 mg total) by mouth daily.    PHQ 2/9 Scores 01/05/2019 09/25/2018 01/13/2018 08/15/2017  PHQ - 2 Score 0 0 0 0  PHQ- 9 Score - 0 0 0    BP Readings from Last 3 Encounters:  01/20/19 130/70  01/05/19 (!) 150/78  10/10/18 138/70    Physical Exam Vitals signs and nursing note reviewed.  Constitutional:      General: She is not in acute distress.    Appearance: She is not diaphoretic.  HENT:     Head: Normocephalic and atraumatic.     Right Ear: Tympanic membrane, ear canal and external ear normal.     Left Ear: Tympanic membrane, ear canal and external ear normal.     Nose: Nose normal. No congestion or rhinorrhea.     Mouth/Throat:     Mouth: Mucous membranes are moist.  Eyes:     General:        Right eye: No discharge.        Left eye: No discharge.     Conjunctiva/sclera: Conjunctivae normal.     Pupils: Pupils are equal, round, and reactive to light.  Neck:     Musculoskeletal: Normal range of motion and neck supple.     Thyroid: No thyromegaly.     Vascular: No JVD.  Cardiovascular:     Rate and Rhythm: Normal rate and regular rhythm.     Heart sounds: Normal heart sounds. No murmur. No friction rub. No gallop.   Pulmonary:     Effort: Pulmonary effort  is normal.     Breath sounds: Normal breath sounds. No wheezing or rhonchi.  Abdominal:     General: Bowel sounds are normal.     Palpations: Abdomen is soft. There is no mass.     Tenderness: There is no abdominal tenderness. There is no right CVA tenderness, left CVA tenderness or guarding.  Musculoskeletal: Normal range of motion.  Lymphadenopathy:     Cervical: No cervical adenopathy.  Skin:    General: Skin is warm and dry.  Neurological:     Mental Status: She is alert.     Deep Tendon  Reflexes: Reflexes are normal and symmetric.     Wt Readings from Last 3 Encounters:  01/20/19 172 lb (78 kg)  01/05/19 168 lb 6.4 oz (76.4 kg)  10/10/18 172 lb (78 kg)    BP 130/70   Pulse 60   Ht 5\' 5"  (1.651 m)   Wt 172 lb (78 kg)   BMI 28.62 kg/m   Assessment and Plan:  1. Essential hypertension Chronic.  Controlled.  Continue amlodipine 5 mg and losartan 25 mg once a day.  Will check renal function panel. - Renal Function Panel - amLODipine (NORVASC) 5 MG tablet; TAKE (1) TABLET BY MOUTH EVERY DAY  Dispense: 90 tablet; Refill: 1 - losartan (COZAAR) 25 MG tablet; Take 1 tablet (25 mg total) by mouth daily.  Dispense: 90 tablet; Refill: 1  2. Mixed hyperlipidemia Chronic.  Controlled.  Continue simvastatin 40 mg once a day at bedtime.  Will check lipid panel. - Lipid Panel With LDL/HDL Ratio - simvastatin (ZOCOR) 40 MG tablet; Take 1 tablet (40 mg total) by mouth at bedtime.  Dispense: 90 tablet; Refill: 1  3. Neuropathy Patient with neuropathy currently controlled on gabapentin 300 mg nightly.  4. Restless leg Chronic.  Controlled.  Continue gabapentin 300 mg for restless leg as well.  5. Mild intermittent reactive airway disease without complication History of intermittent asthma primarily in the fall months.  Patient will have access to albuterol inhaler 2 puffs every 6 hours as needed wheezing. - albuterol (VENTOLIN HFA) 108 (90 Base) MCG/ACT inhaler; Inhale 2 puffs into the lungs every 6 (six) hours as needed for wheezing or shortness of breath.  Dispense: 6.7 g; Refill: 1  6. Spinal stenosis of lumbar region, unspecified whether neurogenic claudication present Patient with a history of spinal stenosis with claudication.  Will continue gabapentin for discomfort in legs. - gabapentin (NEURONTIN) 300 MG capsule; TAKE (1) CAPSULE BY MOUTH AT BEDTIME  Dispense: 90 capsule; Refill: 1  7. Influenza vaccine needed Discussed and administered.  8. Age related  osteoporosis, unspecified pathological fracture presence Patient is unable to tolerate Fosamax.  Will encourage patient to take vitamin D 800 units over-the-counter as well as calcium 1200 mg daily.

## 2019-01-21 ENCOUNTER — Other Ambulatory Visit: Payer: Self-pay

## 2019-01-21 DIAGNOSIS — N3000 Acute cystitis without hematuria: Secondary | ICD-10-CM

## 2019-01-21 LAB — LIPID PANEL WITH LDL/HDL RATIO
Cholesterol, Total: 142 mg/dL (ref 100–199)
HDL: 32 mg/dL — ABNORMAL LOW (ref >39)
LDL Calculated: 71 mg/dL (ref 0–99)
LDl/HDL Ratio: 2.2 ratio (ref 0.0–3.2)
Triglycerides: 194 mg/dL — ABNORMAL HIGH (ref 0–149)
VLDL Cholesterol Cal: 39 mg/dL (ref 5–40)

## 2019-01-21 LAB — RENAL FUNCTION PANEL
Albumin: 4.4 g/dL (ref 3.7–4.7)
BUN/Creatinine Ratio: 17 (ref 12–28)
BUN: 12 mg/dL (ref 8–27)
CO2: 26 mmol/L (ref 20–29)
Calcium: 9.9 mg/dL (ref 8.7–10.3)
Chloride: 102 mmol/L (ref 96–106)
Creatinine, Ser: 0.69 mg/dL (ref 0.57–1.00)
GFR calc Af Amer: 97 mL/min/{1.73_m2} (ref >59)
GFR calc non Af Amer: 84 mL/min/{1.73_m2} (ref >59)
Glucose: 120 mg/dL — ABNORMAL HIGH (ref 65–99)
Phosphorus: 2.8 mg/dL — ABNORMAL LOW (ref 3.0–4.3)
Potassium: 3.8 mmol/L (ref 3.5–5.2)
Sodium: 143 mmol/L (ref 134–144)

## 2019-01-21 MED ORDER — NITROFURANTOIN MONOHYD MACRO 100 MG PO CAPS: 100.0000 mg | ORAL_CAPSULE | Freq: Two times a day (BID) | ORAL | 0 refills | Status: DC

## 2019-01-21 NOTE — Progress Notes (Unsigned)
Sent in [place] to [facility]

## 2019-03-12 ENCOUNTER — Encounter: Payer: Self-pay | Admitting: Family Medicine

## 2019-03-12 ENCOUNTER — Ambulatory Visit
Admission: RE | Admit: 2019-03-12 | Discharge: 2019-03-12 | Disposition: A | Discharge status: Home / Self Care | Payer: Medicare Other | Attending: Family Medicine | Admitting: Family Medicine

## 2019-03-12 ENCOUNTER — Other Ambulatory Visit: Payer: Self-pay

## 2019-03-12 ENCOUNTER — Ambulatory Visit (INDEPENDENT_AMBULATORY_CARE_PROVIDER_SITE_OTHER): Payer: Medicare Other | Admitting: Family Medicine

## 2019-03-12 ENCOUNTER — Ambulatory Visit
Admission: RE | Admit: 2019-03-12 | Discharge: 2019-03-12 | Disposition: A | Discharge status: Home / Self Care | Payer: Medicare Other | Source: Ambulatory Visit | Attending: Family Medicine | Admitting: Family Medicine

## 2019-03-12 VITALS — BP 140/70 | HR 82 | Temp 98.0°F | Ht 65.0 in | Wt 171.0 lb

## 2019-03-12 DIAGNOSIS — R05 Cough: Secondary | ICD-10-CM

## 2019-03-12 DIAGNOSIS — J4520 Mild intermittent asthma, uncomplicated: Secondary | ICD-10-CM | POA: Diagnosis not present

## 2019-03-12 DIAGNOSIS — J0100 Acute maxillary sinusitis, unspecified: Secondary | ICD-10-CM | POA: Diagnosis not present

## 2019-03-12 DIAGNOSIS — R0602 Shortness of breath: Secondary | ICD-10-CM | POA: Diagnosis not present

## 2019-03-12 DIAGNOSIS — R059 Cough, unspecified: Secondary | ICD-10-CM

## 2019-03-12 DIAGNOSIS — Z20822 Contact with and (suspected) exposure to covid-19: Secondary | ICD-10-CM

## 2019-03-12 MED ORDER — ALBUTEROL SULFATE HFA 108 (90 BASE) MCG/ACT IN AERS: 2.0000 | INHALATION_SPRAY | Freq: Four times a day (QID) | RESPIRATORY_TRACT | 1 refills | Status: DC | PRN

## 2019-03-12 MED ORDER — GUAIFENESIN-CODEINE 100-10 MG/5ML PO SYRP: 5.0000 mL | ORAL_SOLUTION | Freq: Four times a day (QID) | ORAL | 0 refills | Status: DC | PRN

## 2019-03-12 MED ORDER — AZITHROMYCIN 250 MG PO TABS: ORAL_TABLET | ORAL | 0 refills | Status: DC

## 2019-03-12 NOTE — Progress Notes (Signed)
Date:  03/12/2019   Name:  Traci Bowen   DOB:  1941-01-18   MRN:  [id number]   Chief Complaint: Sinusitis (cough with yellow production- taking mucinex )  Sinusitis This is a chronic problem. The current episode started more than 1 year ago. The problem has been waxing and waning since onset. There has been no fever. The pain is mild. Associated symptoms include congestion, coughing, headaches, a hoarse voice and sinus pressure. Pertinent negatives include no chills, diaphoresis, ear pain, neck pain, shortness of breath, sneezing, sore throat or swollen glands. (Prod nasal discharge/) Past treatments include oral decongestants (mucinex). The treatment provided no relief.    Review of Systems  Constitutional: Negative.  Negative for chills, diaphoresis, fatigue, fever and unexpected weight change.  HENT: Positive for congestion, hoarse voice, postnasal drip, sinus pressure and sinus pain. Negative for ear discharge, ear pain, rhinorrhea, sneezing and sore throat.   Eyes: Negative for photophobia, pain, discharge, redness and itching.  Respiratory: Positive for cough and wheezing. Negative for shortness of breath and stridor.   Cardiovascular: Negative for chest pain, palpitations and leg swelling.  Gastrointestinal: Negative for abdominal pain, blood in stool, constipation, diarrhea, nausea and vomiting.  Endocrine: Negative for cold intolerance, heat intolerance, polydipsia, polyphagia and polyuria.  Genitourinary: Negative for dysuria, flank pain, frequency, hematuria, menstrual problem, pelvic pain, urgency, vaginal bleeding and vaginal discharge.  Musculoskeletal: Negative for arthralgias, back pain, myalgias and neck pain.  Skin: Negative for rash.  Allergic/Immunologic: Negative for environmental allergies and food allergies.  Neurological: Positive for headaches. Negative for dizziness, weakness, light-headedness and numbness.  Hematological: Negative for adenopathy. Does  not bruise/bleed easily.  Psychiatric/Behavioral: Negative for dysphoric mood. The patient is not nervous/anxious.     Patient Active Problem List   Diagnosis Date Noted  . Sebaceous cyst 07/25/2018  . Neuropathy 01/06/2016  . Essential hypertension 06/01/2015  . Hyperlipidemia 06/01/2015    Allergies  Allergen Reactions  . Iodinated Diagnostic Agents   . Penicillins     Past Surgical History:  Procedure Laterality Date  . CATARACT EXTRACTION Bilateral   . EYE SURGERY Left march 2016   cysts removed   . LITHOTRIPSY    . VAGINAL HYSTERECTOMY      Social History   Tobacco Use  . Smoking status: Former Smoker    Packs/day: 0.50    Years: 3.00    Pack years: 1.50    Types: Cigarettes    Quit date: 1989    Years since quitting: 31.8  . Smokeless tobacco: Never Used  . Tobacco comment: smoking cessation materials not required  Substance Use Topics  . Alcohol use: No    Alcohol/week: 0.0 standard drinks  . Drug use: No     Medication list has been reviewed and updated.  Current Meds  Medication Sig  . amLODipine (NORVASC) 5 MG tablet TAKE (1) TABLET BY MOUTH EVERY DAY  . Cholecalciferol (VITAMIN D) 2000 units tablet Take by mouth.  . gabapentin (NEURONTIN) 300 MG capsule TAKE (1) CAPSULE BY MOUTH AT BEDTIME  . losartan (COZAAR) 25 MG tablet Take 1 tablet (25 mg total) by mouth daily.  . Multiple Vitamins-Minerals (CENTRUM SILVER PO) Take 1 capsule by mouth daily at 6 (six) AM.  . Omega-3 Fatty Acids (FISH OIL) 1000 MG CAPS Take 1 capsule (1,000 mg total) by mouth 2 (two) times daily.  . simvastatin (ZOCOR) 40 MG tablet Take 1 tablet (40 mg total) by mouth at bedtime.  . vitamin  B-12 (CYANOCOBALAMIN) 1000 MCG tablet Take 1,000 mcg by mouth daily.  . [DISCONTINUED] nitrofurantoin, macrocrystal-monohydrate, (MACROBID) 100 MG capsule Take 1 capsule (100 mg total) by mouth 2 (two) times daily.    PHQ 2/9 Scores 01/05/2019 09/25/2018 01/13/2018 08/15/2017  PHQ - 2 Score 0  0 0 0  PHQ- 9 Score - 0 0 0    BP Readings from Last 3 Encounters:  03/12/19 140/70  01/20/19 130/70  01/05/19 (!) 150/78    Physical Exam Vitals signs and nursing note reviewed.  Constitutional:      Appearance: She is well-developed.  HENT:     Head: Normocephalic.     Jaw: There is normal jaw occlusion.     Right Ear: Hearing, tympanic membrane, ear canal and external ear normal. There is no impacted cerumen.     Left Ear: Hearing, tympanic membrane, ear canal and external ear normal. There is no impacted cerumen.     Nose: No congestion or rhinorrhea.     Right Turbinates: Not enlarged or swollen.     Left Turbinates: Not enlarged or swollen.     Right Sinus: Maxillary sinus tenderness and frontal sinus tenderness present.     Left Sinus: Maxillary sinus tenderness and frontal sinus tenderness present.     Mouth/Throat:     Mouth: Mucous membranes are moist.  Eyes:     General: Lids are everted, no foreign bodies appreciated. No scleral icterus.       Left eye: No foreign body or hordeolum.     Conjunctiva/sclera: Conjunctivae normal.     Right eye: Right conjunctiva is not injected.     Left eye: Left conjunctiva is not injected.     Pupils: Pupils are equal, round, and reactive to light.  Neck:     Musculoskeletal: Full passive range of motion without pain, normal range of motion and neck supple.     Thyroid: No thyromegaly.     Vascular: No JVD.     Trachea: No tracheal deviation.  Cardiovascular:     Rate and Rhythm: Normal rate and regular rhythm.     Chest Wall: PMI is displaced. No thrill.     Heart sounds: Normal heart sounds, S1 normal and S2 normal. No murmur. No systolic murmur. No diastolic murmur. No friction rub. No gallop. No S3 or S4 sounds.   Pulmonary:     Effort: Pulmonary effort is normal. No respiratory distress.     Breath sounds: Decreased breath sounds, wheezing and rhonchi present. No rales.  Abdominal:     General: Bowel sounds are normal.      Palpations: Abdomen is soft. There is no mass.     Tenderness: There is no abdominal tenderness. There is no guarding or rebound.  Musculoskeletal: Normal range of motion.        General: No tenderness.  Lymphadenopathy:     Cervical: No cervical adenopathy.     Right cervical: No superficial, deep or posterior cervical adenopathy.    Left cervical: No superficial, deep or posterior cervical adenopathy.  Skin:    General: Skin is warm.     Findings: No rash.  Neurological:     Mental Status: She is alert and oriented to person, place, and time.     Cranial Nerves: No cranial nerve deficit.     Deep Tendon Reflexes: Reflexes normal.  Psychiatric:        Mood and Affect: Mood is not anxious or depressed.     Wt Readings  from Last 3 Encounters:  03/12/19 171 lb (77.6 kg)  01/20/19 172 lb (78 kg)  01/05/19 168 lb 6.4 oz (76.4 kg)    BP 140/70   Pulse 82   Temp 98 F (36.7 C) (Oral)   Ht 5\' 5"  (1.651 m)   Wt 171 lb (77.6 kg)   BMI 28.46 kg/m   Assessment and Plan: 1. Acute maxillary sinusitis, recurrence not specified Acute.  Persistent.  Uncontrolled.  History and exam is consistent with maxillary sinusitis/frontal sinusitis with tenderness over these areas as well as purulent nasal discharge this week.  We will initiate a azithromycin 250 today followed by 1 a day for 4 days and Robitussin-AC.  Patient is to continue her Singulair. - DG Chest 2 View; Future  2. Mild intermittent reactive airway disease without complication And has history in the past of mild mild intermittent reactive airway disease consistent with recurrent asthma.  Patient has had inhalers in the past and we will refill her albuterol 2 puffs every 6 hours as needed wheezing or dyspnea. - albuterol (VENTOLIN HFA) 108 (90 Base) MCG/ACT inhaler; Inhale 2 puffs into the lungs every 6 (six) hours as needed for wheezing or shortness of breath.  Dispense: 18 g; Refill: 1  3. Cough Upon evaluation of her cough  the patient brings up to [place] upon her arrival patient has had a persistent cough for 5 days with some shortness of breath and paroxysmal episodes of persistent coughing.  On physical exam it was noted that patient has scattered rhonchi with a pulse ox of 96%.  We will obtain a chest x-ray. - DG Chest 2 View; Future - albuterol (VENTOLIN HFA) 108 (90 Base) MCG/ACT inhaler; Inhale 2 puffs into the lungs every 6 (six) hours as needed for wheezing or shortness of breath.  Dispense: 18 g; Refill: 1  Addendum: It was noted on chest x-ray that the patient has a focal opacity in the medial left lung base raising the question of an early infiltrate.  Given the persistence of the cough and the x-ray evaluation patient has been sent for COVID testing and instructed to self isolate until results are back.

## 2019-03-13 NOTE — Addendum Note (Signed)
Addended by: Juline Patch on: 03/13/2019 08:08 AM   Modules accepted: Level of Service

## 2019-03-14 LAB — NOVEL CORONAVIRUS, NAA: SARS-CoV-2, NAA: NOT DETECTED

## 2019-04-08 ENCOUNTER — Other Ambulatory Visit: Payer: Self-pay

## 2019-04-08 DIAGNOSIS — Z20828 Contact with and (suspected) exposure to other viral communicable diseases: Secondary | ICD-10-CM | POA: Diagnosis not present

## 2019-04-08 DIAGNOSIS — Z20822 Contact with and (suspected) exposure to covid-19: Secondary | ICD-10-CM

## 2019-04-10 LAB — NOVEL CORONAVIRUS, NAA: SARS-CoV-2, NAA: NOT DETECTED

## 2019-08-25 ENCOUNTER — Other Ambulatory Visit: Payer: Self-pay | Admitting: Family Medicine

## 2019-08-25 DIAGNOSIS — I10 Essential (primary) hypertension: Secondary | ICD-10-CM

## 2019-08-25 DIAGNOSIS — E782 Mixed hyperlipidemia: Secondary | ICD-10-CM

## 2019-09-01 ENCOUNTER — Other Ambulatory Visit: Payer: Self-pay

## 2019-09-01 ENCOUNTER — Ambulatory Visit
Admission: RE | Admit: 2019-09-01 | Discharge: 2019-09-01 | Disposition: A | Discharge status: Home / Self Care | Payer: Medicare Other | Attending: Family Medicine | Admitting: Family Medicine

## 2019-09-01 ENCOUNTER — Ambulatory Visit (INDEPENDENT_AMBULATORY_CARE_PROVIDER_SITE_OTHER): Payer: Medicare Other | Admitting: Family Medicine

## 2019-09-01 ENCOUNTER — Encounter: Payer: Self-pay | Admitting: Family Medicine

## 2019-09-01 ENCOUNTER — Ambulatory Visit
Admission: RE | Admit: 2019-09-01 | Discharge: 2019-09-01 | Disposition: A | Discharge status: Home / Self Care | Payer: Medicare Other | Source: Ambulatory Visit | Attending: Family Medicine | Admitting: Family Medicine

## 2019-09-01 VITALS — BP 140/80 | HR 64 | Ht 65.0 in | Wt 171.0 lb

## 2019-09-01 DIAGNOSIS — I10 Essential (primary) hypertension: Secondary | ICD-10-CM | POA: Diagnosis not present

## 2019-09-01 DIAGNOSIS — M48061 Spinal stenosis, lumbar region without neurogenic claudication: Secondary | ICD-10-CM | POA: Diagnosis not present

## 2019-09-01 DIAGNOSIS — R9389 Abnormal findings on diagnostic imaging of other specified body structures: Secondary | ICD-10-CM | POA: Insufficient documentation

## 2019-09-01 DIAGNOSIS — E782 Mixed hyperlipidemia: Secondary | ICD-10-CM | POA: Diagnosis not present

## 2019-09-01 DIAGNOSIS — F17200 Nicotine dependence, unspecified, uncomplicated: Secondary | ICD-10-CM

## 2019-09-01 DIAGNOSIS — R079 Chest pain, unspecified: Secondary | ICD-10-CM | POA: Diagnosis not present

## 2019-09-01 DIAGNOSIS — I517 Cardiomegaly: Secondary | ICD-10-CM | POA: Insufficient documentation

## 2019-09-01 DIAGNOSIS — I700 Atherosclerosis of aorta: Secondary | ICD-10-CM | POA: Diagnosis not present

## 2019-09-01 DIAGNOSIS — J4520 Mild intermittent asthma, uncomplicated: Secondary | ICD-10-CM

## 2019-09-01 MED ORDER — SIMVASTATIN 40 MG PO TABS: 40.0000 mg | ORAL_TABLET | Freq: Every day | ORAL | 1 refills | Status: DC

## 2019-09-01 MED ORDER — LOSARTAN POTASSIUM 25 MG PO TABS: 25.0000 mg | ORAL_TABLET | Freq: Every day | ORAL | 1 refills | Status: DC

## 2019-09-01 MED ORDER — GABAPENTIN 300 MG PO CAPS: ORAL_CAPSULE | ORAL | 1 refills | Status: DC

## 2019-09-01 MED ORDER — ALBUTEROL SULFATE HFA 108 (90 BASE) MCG/ACT IN AERS: 2.0000 | INHALATION_SPRAY | Freq: Four times a day (QID) | RESPIRATORY_TRACT | 1 refills | Status: DC | PRN

## 2019-09-01 MED ORDER — AMLODIPINE BESYLATE 5 MG PO TABS: ORAL_TABLET | ORAL | 1 refills | Status: DC

## 2019-09-01 NOTE — Progress Notes (Signed)
Date:  09/01/2019   Name:  Traci Bowen   DOB:  [birth year]   MRN:  [id number]   Chief Complaint: COPD, Hyperlipidemia, Hypertension, and restless leg  Patient is a 79 year old female who presents for a comprehensive physical exam. The patient reports the following problems: chest discomfort/ history abn chest x ray. Health maintenance has been reviewed up to date.  Chest Pain  This is a new problem. Episode onset: 6 times last month. The onset quality is sudden. The problem occurs intermittently. The pain is present in the substernal region (left of sternum). The pain is at a severity of 3/10. The pain is moderate. The quality of the pain is described as sharp (lightning/? tearing). The pain does not radiate. Pertinent negatives include no abdominal pain, back pain, cough, dizziness, fever, headaches, irregular heartbeat, lower extremity edema, nausea, near-syncope, numbness, palpitations, shortness of breath, vomiting or weakness. The treatment provided moderate relief.    Lab Results  Component Value Date   CREATININE 0.69 01/20/2019   BUN 12 01/20/2019   NA 143 01/20/2019   K 3.8 01/20/2019   CL 102 01/20/2019   CO2 26 01/20/2019   Lab Results  Component Value Date   CHOL 142 01/20/2019   HDL 32 (L) 01/20/2019   LDLCALC 71 01/20/2019   TRIG 194 (H) 01/20/2019   CHOLHDL 3.5 01/13/2018   No results found for: TSH No results found for: HGBA1C Lab Results  Component Value Date   WBC 8.8 06/14/2017   HGB 15.6 06/14/2017   HCT 45.6 06/14/2017   MCV 86.6 06/14/2017   PLT 279 06/14/2017   No results found for: ALT, AST, GGT, ALKPHOS, BILITOT   Review of Systems  Constitutional: Negative.  Negative for chills, fatigue, fever and unexpected weight change.  HENT: Negative for congestion, ear discharge, ear pain, rhinorrhea, sinus pressure, sneezing and sore throat.   Eyes: Negative for photophobia, pain, discharge, redness and itching.  Respiratory: Negative for  cough, shortness of breath, wheezing and stridor.   Cardiovascular: Positive for chest pain. Negative for palpitations and near-syncope.  Gastrointestinal: Negative for abdominal pain, blood in stool, constipation, diarrhea, nausea and vomiting.  Endocrine: Negative for cold intolerance, heat intolerance, polydipsia, polyphagia and polyuria.  Genitourinary: Negative for dysuria, flank pain, frequency, hematuria, menstrual problem, pelvic pain, urgency, vaginal bleeding and vaginal discharge.  Musculoskeletal: Negative for arthralgias, back pain and myalgias.  Skin: Negative for rash.  Allergic/Immunologic: Negative for environmental allergies and food allergies.  Neurological: Negative for dizziness, weakness, light-headedness, numbness and headaches.  Hematological: Negative for adenopathy. Does not bruise/bleed easily.  Psychiatric/Behavioral: Negative for dysphoric mood. The patient is not nervous/anxious.     Patient Active Problem List   Diagnosis Date Noted  . Sebaceous cyst 07/25/2018  . Neuropathy 01/06/2016  . Essential hypertension 06/01/2015  . Hyperlipidemia 06/01/2015    Allergies  Allergen Reactions  . Iodinated Diagnostic Agents   . Penicillins     Past Surgical History:  Procedure Laterality Date  . CATARACT EXTRACTION Bilateral   . EYE SURGERY Left march 2016   cysts removed   . LITHOTRIPSY    . VAGINAL HYSTERECTOMY      Social History   Tobacco Use  . Smoking status: Former Smoker    Packs/day: 0.50    Years: 3.00    Pack years: 1.50    Types: Cigarettes    Quit date: 1989    Years since quitting: 32.2  . Smokeless tobacco: Never Used  .  Tobacco comment: smoking cessation materials not required  Substance Use Topics  . Alcohol use: No    Alcohol/week: 0.0 standard drinks  . Drug use: No     Medication list has been reviewed and updated.  Current Meds  Medication Sig  . albuterol (VENTOLIN HFA) 108 (90 Base) MCG/ACT inhaler Inhale 2 puffs  into the lungs every 6 (six) hours as needed for wheezing or shortness of breath.  Marland Kitchen albuterol (VENTOLIN HFA) 108 (90 Base) MCG/ACT inhaler Inhale 2 puffs into the lungs every 6 (six) hours as needed for wheezing or shortness of breath.  Marland Kitchen amLODipine (NORVASC) 5 MG tablet TAKE (1) TABLET BY MOUTH EVERY DAY  . Cholecalciferol (VITAMIN D) 2000 units tablet Take by mouth.  . gabapentin (NEURONTIN) 300 MG capsule TAKE (1) CAPSULE BY MOUTH AT BEDTIME  . losartan (COZAAR) 25 MG tablet Take 1 tablet (25 mg total) by mouth daily.  . Multiple Vitamins-Minerals (CENTRUM SILVER PO) Take 1 capsule by mouth daily at 6 (six) AM.  . Omega-3 Fatty Acids (FISH OIL) 1000 MG CAPS Take 1 capsule (1,000 mg total) by mouth 2 (two) times daily.  . simvastatin (ZOCOR) 40 MG tablet Take 1 tablet (40 mg total) by mouth at bedtime.  . vitamin B-12 (CYANOCOBALAMIN) 1000 MCG tablet Take 1,000 mcg by mouth daily.  . Zinc 100 MG TABS Take 1 tablet by mouth daily.  . [DISCONTINUED] albuterol (VENTOLIN HFA) 108 (90 Base) MCG/ACT inhaler Inhale 2 puffs into the lungs every 6 (six) hours as needed for wheezing or shortness of breath.  . [DISCONTINUED] amLODipine (NORVASC) 5 MG tablet TAKE (1) TABLET BY MOUTH EVERY DAY  . [DISCONTINUED] gabapentin (NEURONTIN) 300 MG capsule TAKE (1) CAPSULE BY MOUTH AT BEDTIME  . [DISCONTINUED] losartan (COZAAR) 25 MG tablet Take 1 tablet (25 mg total) by mouth daily.  . [DISCONTINUED] simvastatin (ZOCOR) 40 MG tablet Take 1 tablet (40 mg total) by mouth at bedtime.    PHQ 2/9 Scores 09/01/2019 01/05/2019 09/25/2018 01/13/2018  PHQ - 2 Score 0 0 0 0  PHQ- 9 Score 0 - 0 0    BP Readings from Last 3 Encounters:  09/01/19 140/80  03/12/19 140/70  01/20/19 130/70    Physical Exam Vitals and nursing note reviewed.  Constitutional:      General: She is not in acute distress.    Appearance: She is not diaphoretic.  HENT:     Head: Normocephalic and atraumatic.     Right Ear: External ear  normal.     Left Ear: External ear normal.     Nose: Nose normal.  Eyes:     General:        Right eye: No discharge.        Left eye: No discharge.     Conjunctiva/sclera: Conjunctivae normal.     Pupils: Pupils are equal, round, and reactive to light.  Neck:     Thyroid: No thyromegaly.     Vascular: No JVD.  Cardiovascular:     Rate and Rhythm: Normal rate and regular rhythm.     Heart sounds: Normal heart sounds. No murmur. No friction rub. No gallop.   Pulmonary:     Effort: Pulmonary effort is normal.     Breath sounds: Normal breath sounds.  Abdominal:     General: Bowel sounds are normal.     Palpations: Abdomen is soft. There is no mass.     Tenderness: There is no abdominal tenderness. There is no guarding.  Musculoskeletal:        General: Normal range of motion.     Cervical back: Normal range of motion and neck supple. Tenderness present.  Lymphadenopathy:     Cervical: No cervical adenopathy.  Skin:    General: Skin is warm and dry.  Neurological:     Mental Status: She is alert.     Deep Tendon Reflexes: Reflexes are normal and symmetric.     Wt Readings from Last 3 Encounters:  09/01/19 171 lb (77.6 kg)  03/12/19 171 lb (77.6 kg)  01/20/19 172 lb (78 kg)    BP 140/80   Pulse 64   Ht 5\' 5"  (1.651 m)   Wt 171 lb (77.6 kg)   BMI 28.46 kg/m   Assessment and Plan:  1. Chest pain at rest New onset.  Atypical.  Relatively stable.  Patient had new onset chest left of sternum of a lightening sharp pain that went up and down the chest which although was not described as tearing that I have concerns of possible dissection or CAD given her history.  Patient has seen Dr. Clayborn Bigness in the past and was to return in 6 months.  EKG was done. I have reviewed EKG which shows rate 61 regular interval PR and QT unremarkable QRSs extended due to left bundle branch block.  Distortion makes it difficult to determine if there is ischemic changes.  Comparison to previous EKG  dated 2017/2019 note left bundle branch block.  Given the discomfort which may be suggestive of a dissection versus CAD patient has been referred to Dr. Clayborn Bigness for reevaluation. - EKG 12-Lead - Ambulatory referral to Cardiology  2. Abnormal chest x-ray Review of chest x-ray is noted some pneumonias in the past the question is whether this is ever cleared completely.  It was noted back as far as 2017 and we will recheck a chest x-ray today.  If not completely cleared we will likely refer for CT of the chest. - Ambulatory referral to Cardiology - DG Chest 2 View; Future  3. Aortic atherosclerosis ([place]) Patient with history of aortic atherosclerosis for which she occasionally takes aspirin.  4. Cardiomegaly Noted to be enlarged on previous chest x-ray.  Patient will be referred to cardiology for further evaluation - DG Chest 2 View; Future  5. Mild intermittent reactive airway disease without complication Medication refilled for 1 to 2 months until patient can return for medication refill appointment - albuterol (VENTOLIN HFA) 108 (90 Base) MCG/ACT inhaler; Inhale 2 puffs into the lungs every 6 (six) hours as needed for wheezing or shortness of breath.  Dispense: 6.7 g; Refill: 1  6. Essential hypertension Medication refilled for 1 to 2 months until patient can return for medication refill - amLODipine (NORVASC) 5 MG tablet; TAKE (1) TABLET BY MOUTH EVERY DAY  Dispense: 90 tablet; Refill: 1 - losartan (COZAAR) 25 MG tablet; Take 1 tablet (25 mg total) by mouth daily.  Dispense: 90 tablet; Refill: 1  7. Spinal stenosis of lumbar region, unspecified whether neurogenic claudication present [place] refill for 1 to 2 months until patient can return for medication refill - gabapentin (NEURONTIN) 300 MG capsule; TAKE (1) CAPSULE BY MOUTH AT BEDTIME  Dispense: 90 capsule; Refill: 1  8. Mixed hyperlipidemia Medication refilled for 1 to 2 months until she can return for medication refill visit -  simvastatin (ZOCOR) 40 MG tablet; Take 1 tablet (40 mg total) by mouth at bedtime.  Dispense: 90 tablet; Refill: 1  9. Smoking Patient  says she does not smoke anymore/patient's sister says area she is still smoking.  Patient says she only smokes when she is mad.  I suspect she is probably smoking now.Patient has been advised of the health risks of smoking and counseled concerning cessation of tobacco products. I spent over 3 minutes for discussion and to answer questions.  Patient has history of abnormal chest x-ray and we will repeat that with the possibility of needing a CT on resolved concerns.

## 2019-09-09 DIAGNOSIS — E1169 Type 2 diabetes mellitus with other specified complication: Secondary | ICD-10-CM | POA: Diagnosis not present

## 2019-09-09 DIAGNOSIS — M545 Low back pain: Secondary | ICD-10-CM | POA: Diagnosis not present

## 2019-09-10 ENCOUNTER — Other Ambulatory Visit
Admission: RE | Admit: 2019-09-10 | Discharge: 2019-09-10 | Disposition: A | Discharge status: Home / Self Care | Payer: Medicare Other | Source: Ambulatory Visit | Attending: Student | Admitting: Student

## 2019-09-10 DIAGNOSIS — I10 Essential (primary) hypertension: Secondary | ICD-10-CM | POA: Diagnosis not present

## 2019-09-10 DIAGNOSIS — J449 Chronic obstructive pulmonary disease, unspecified: Secondary | ICD-10-CM | POA: Diagnosis not present

## 2019-09-10 DIAGNOSIS — E8779 Other fluid overload: Secondary | ICD-10-CM | POA: Insufficient documentation

## 2019-09-10 DIAGNOSIS — I208 Other forms of angina pectoris: Secondary | ICD-10-CM | POA: Diagnosis not present

## 2019-09-10 DIAGNOSIS — R0989 Other specified symptoms and signs involving the circulatory and respiratory systems: Secondary | ICD-10-CM | POA: Diagnosis not present

## 2019-09-10 DIAGNOSIS — R0602 Shortness of breath: Secondary | ICD-10-CM | POA: Insufficient documentation

## 2019-09-10 DIAGNOSIS — E782 Mixed hyperlipidemia: Secondary | ICD-10-CM | POA: Diagnosis not present

## 2019-09-10 DIAGNOSIS — R011 Cardiac murmur, unspecified: Secondary | ICD-10-CM | POA: Diagnosis not present

## 2019-09-10 DIAGNOSIS — I7090 Unspecified atherosclerosis: Secondary | ICD-10-CM | POA: Diagnosis not present

## 2019-09-10 LAB — BRAIN NATRIURETIC PEPTIDE: B Natriuretic Peptide: 59 pg/mL (ref 0.0–100.0)

## 2019-10-01 ENCOUNTER — Ambulatory Visit (INDEPENDENT_AMBULATORY_CARE_PROVIDER_SITE_OTHER): Payer: Medicare Other | Admitting: Family Medicine

## 2019-10-01 ENCOUNTER — Encounter: Payer: Self-pay | Admitting: Family Medicine

## 2019-10-01 ENCOUNTER — Other Ambulatory Visit: Payer: Self-pay

## 2019-10-01 VITALS — BP 130/80 | HR 74 | Ht 65.0 in | Wt 171.0 lb

## 2019-10-01 DIAGNOSIS — L309 Dermatitis, unspecified: Secondary | ICD-10-CM

## 2019-10-01 DIAGNOSIS — E782 Mixed hyperlipidemia: Secondary | ICD-10-CM

## 2019-10-01 DIAGNOSIS — I10 Essential (primary) hypertension: Secondary | ICD-10-CM | POA: Diagnosis not present

## 2019-10-01 MED ORDER — TRIAMCINOLONE ACETONIDE 0.1 % EX CREA: 1.0000 "application " | TOPICAL_CREAM | Freq: Two times a day (BID) | CUTANEOUS | 0 refills | Status: AC

## 2019-10-01 NOTE — Progress Notes (Signed)
Date:  10/01/2019   Name:  Traci Bowen   DOB:  [birth year]   MRN:  [id number]   Chief Complaint: Hypertension (the meds do not need to be sent in), Hyperlipidemia, and restless leg  Hypertension This is a chronic problem. The current episode started more than 1 year ago. The problem has been gradually improving since onset. The problem is controlled. Pertinent negatives include no anxiety, blurred vision, chest pain, headaches, malaise/fatigue, neck pain, orthopnea, palpitations, peripheral edema, PND, shortness of breath or sweats. There are no associated agents to hypertension. There are no known risk factors for coronary artery disease. Past treatments include angiotensin blockers and calcium channel blockers. The current treatment provides moderate improvement. There are no compliance problems.  There is no history of angina, kidney disease, CAD/MI, CVA, heart failure, left ventricular hypertrophy, PVD or retinopathy. There is no history of chronic renal disease, a hypertension causing med or renovascular disease.  Hyperlipidemia This is a chronic problem. The problem is controlled. Recent lipid tests were reviewed and are normal. She has no history of chronic renal disease, diabetes, hypothyroidism, liver disease, obesity or nephrotic syndrome. Pertinent negatives include no chest pain, focal sensory loss, focal weakness, leg pain, myalgias or shortness of breath. Current antihyperlipidemic treatment includes statins.  Rash This is a new problem. The current episode started in the past 7 days. The problem has been waxing and waning since onset. The affected locations include the left hand. The rash is characterized by redness and itchiness. Pertinent negatives include no shortness of breath.    Lab Results  Component Value Date   CREATININE 0.69 01/20/2019   BUN 12 01/20/2019   NA 143 01/20/2019   K 3.8 01/20/2019   CL 102 01/20/2019   CO2 26 01/20/2019   Lab Results    Component Value Date   CHOL 142 01/20/2019   HDL 32 (L) 01/20/2019   LDLCALC 71 01/20/2019   TRIG 194 (H) 01/20/2019   CHOLHDL 3.5 01/13/2018   No results found for: TSH No results found for: HGBA1C Lab Results  Component Value Date   WBC 8.8 06/14/2017   HGB 15.6 06/14/2017   HCT 45.6 06/14/2017   MCV 86.6 06/14/2017   PLT 279 06/14/2017   No results found for: ALT, AST, GGT, ALKPHOS, BILITOT   Review of Systems  Constitutional: Negative for malaise/fatigue.  Eyes: Negative for blurred vision.  Respiratory: Negative for shortness of breath.   Cardiovascular: Negative for chest pain, palpitations, orthopnea and PND.  Musculoskeletal: Negative for myalgias and neck pain.  Skin: Positive for rash.  Neurological: Negative for focal weakness and headaches.    Patient Active Problem List   Diagnosis Date Noted  . Sebaceous cyst 07/25/2018  . Neuropathy 01/06/2016  . Essential hypertension 06/01/2015  . Hyperlipidemia 06/01/2015    Allergies  Allergen Reactions  . Iodinated Diagnostic Agents   . Penicillins     Past Surgical History:  Procedure Laterality Date  . CATARACT EXTRACTION Bilateral   . EYE SURGERY Left march 2016   cysts removed   . LITHOTRIPSY    . VAGINAL HYSTERECTOMY      Social History   Tobacco Use  . Smoking status: Former Smoker    Packs/day: 0.50    Years: 3.00    Pack years: 1.50    Types: Cigarettes    Quit date: 1989    Years since quitting: 32.3  . Smokeless tobacco: Never Used  . Tobacco comment: smoking cessation materials  not required  Substance Use Topics  . Alcohol use: No    Alcohol/week: 0.0 standard drinks  . Drug use: No     Medication list has been reviewed and updated.  Current Meds  Medication Sig  . amLODipine (NORVASC) 5 MG tablet TAKE (1) TABLET BY MOUTH EVERY DAY  . Cholecalciferol (VITAMIN D) 2000 units tablet Take by mouth.  . gabapentin (NEURONTIN) 300 MG capsule TAKE (1) CAPSULE BY MOUTH AT BEDTIME   . losartan (COZAAR) 25 MG tablet Take 1 tablet (25 mg total) by mouth daily.  . Multiple Vitamins-Minerals (CENTRUM SILVER PO) Take 1 capsule by mouth daily at 6 (six) AM.  . Omega-3 Fatty Acids (FISH OIL) 1000 MG CAPS Take 1 capsule (1,000 mg total) by mouth 2 (two) times daily.  . simvastatin (ZOCOR) 40 MG tablet Take 1 tablet (40 mg total) by mouth at bedtime.  . vitamin B-12 (CYANOCOBALAMIN) 1000 MCG tablet Take 1,000 mcg by mouth daily.  . Zinc 100 MG TABS Take 1 tablet by mouth daily.    PHQ 2/9 Scores 10/01/2019 09/01/2019 01/05/2019 09/25/2018  PHQ - 2 Score 0 0 0 0  PHQ- 9 Score 0 0 - 0    BP Readings from Last 3 Encounters:  10/01/19 130/80  09/01/19 140/80  03/12/19 140/70    Physical Exam  Wt Readings from Last 3 Encounters:  10/01/19 171 lb (77.6 kg)  09/01/19 171 lb (77.6 kg)  03/12/19 171 lb (77.6 kg)    BP 130/80   Pulse 74   Ht 5\' 5"  (1.651 m)   Wt 171 lb (77.6 kg)   BMI 28.46 kg/m   Assessment and Plan: 1. Mixed hyperlipidemia Chronic.  Controlled.  Stable.  Patient will continue simvastatin 40 mg once a day.  We will check lipid panel to assess degree of control. - Lipid Panel With LDL/HDL Ratio  2. Essential hypertension Chronic.  Controlled.  Stable.  Patient will continue amlodipine 5 mg in addition to losartan 25 mg daily.  We will check comprehensive metabolic panel to assess electrolytes and GFR. - Comprehensive metabolic panel  3. Dermatitis New onset.  Persistent.  Uncontrolled sec secondary to continuance of pruritus.  There is a small area of scaliness consistent with eczema/dermatitis of the right base of the thumb.  We will initiate triamcinolone cream 0.1% to be applied twice a day. - triamcinolone cream (KENALOG) 0.1 %; Apply 1 application topically 2 (two) times daily.  Dispense: 30 g; Refill: 0

## 2019-10-02 LAB — LIPID PANEL WITH LDL/HDL RATIO
Cholesterol, Total: 175 mg/dL (ref 100–199)
HDL: 40 mg/dL (ref >39)
LDL Chol Calc (NIH): 101 mg/dL — ABNORMAL HIGH (ref 0–99)
LDL/HDL Ratio: 2.5 ratio (ref 0.0–3.2)
Triglycerides: 194 mg/dL — ABNORMAL HIGH (ref 0–149)
VLDL Cholesterol Cal: 34 mg/dL (ref 5–40)

## 2019-10-02 LAB — COMPREHENSIVE METABOLIC PANEL
ALT: 24 IU/L (ref 0–32)
AST: 27 IU/L (ref 0–40)
Albumin/Globulin Ratio: 1.8 (ref 1.2–2.2)
Albumin: 4.6 g/dL (ref 3.7–4.7)
Alkaline Phosphatase: 64 IU/L (ref 39–117)
BUN/Creatinine Ratio: 15 (ref 12–28)
BUN: 10 mg/dL (ref 8–27)
Bilirubin Total: 0.4 mg/dL (ref 0.0–1.2)
CO2: 29 mmol/L (ref 20–29)
Calcium: 10.8 mg/dL — ABNORMAL HIGH (ref 8.7–10.3)
Chloride: 101 mmol/L (ref 96–106)
Creatinine, Ser: 0.68 mg/dL (ref 0.57–1.00)
GFR calc Af Amer: 97 mL/min/{1.73_m2} (ref >59)
GFR calc non Af Amer: 84 mL/min/{1.73_m2} (ref >59)
Globulin, Total: 2.6 g/dL (ref 1.5–4.5)
Glucose: 112 mg/dL — ABNORMAL HIGH (ref 65–99)
Potassium: 4 mmol/L (ref 3.5–5.2)
Sodium: 145 mmol/L — ABNORMAL HIGH (ref 134–144)
Total Protein: 7.2 g/dL (ref 6.0–8.5)

## 2019-10-19 DIAGNOSIS — R0989 Other specified symptoms and signs involving the circulatory and respiratory systems: Secondary | ICD-10-CM | POA: Diagnosis not present

## 2019-10-19 DIAGNOSIS — I208 Other forms of angina pectoris: Secondary | ICD-10-CM | POA: Diagnosis not present

## 2019-10-22 DIAGNOSIS — E119 Type 2 diabetes mellitus without complications: Secondary | ICD-10-CM | POA: Diagnosis not present

## 2019-10-22 DIAGNOSIS — I712 Thoracic aortic aneurysm, without rupture: Secondary | ICD-10-CM | POA: Diagnosis not present

## 2019-10-22 DIAGNOSIS — R0989 Other specified symptoms and signs involving the circulatory and respiratory systems: Secondary | ICD-10-CM | POA: Diagnosis not present

## 2019-10-22 DIAGNOSIS — I7090 Unspecified atherosclerosis: Secondary | ICD-10-CM | POA: Diagnosis not present

## 2019-10-22 DIAGNOSIS — E782 Mixed hyperlipidemia: Secondary | ICD-10-CM | POA: Diagnosis not present

## 2019-10-22 DIAGNOSIS — R011 Cardiac murmur, unspecified: Secondary | ICD-10-CM | POA: Diagnosis not present

## 2019-10-22 DIAGNOSIS — I6523 Occlusion and stenosis of bilateral carotid arteries: Secondary | ICD-10-CM | POA: Diagnosis not present

## 2019-10-22 DIAGNOSIS — I208 Other forms of angina pectoris: Secondary | ICD-10-CM | POA: Diagnosis not present

## 2019-10-22 DIAGNOSIS — J449 Chronic obstructive pulmonary disease, unspecified: Secondary | ICD-10-CM | POA: Diagnosis not present

## 2019-10-22 DIAGNOSIS — I10 Essential (primary) hypertension: Secondary | ICD-10-CM | POA: Diagnosis not present

## 2019-10-27 ENCOUNTER — Other Ambulatory Visit (HOSPITAL_COMMUNITY): Payer: Self-pay | Admitting: Internal Medicine

## 2019-10-27 ENCOUNTER — Other Ambulatory Visit: Payer: Self-pay | Admitting: Internal Medicine

## 2019-10-27 DIAGNOSIS — I712 Thoracic aortic aneurysm, without rupture, unspecified: Secondary | ICD-10-CM

## 2019-11-04 DIAGNOSIS — I6523 Occlusion and stenosis of bilateral carotid arteries: Secondary | ICD-10-CM | POA: Diagnosis not present

## 2019-11-05 ENCOUNTER — Other Ambulatory Visit: Payer: Self-pay

## 2019-11-05 ENCOUNTER — Ambulatory Visit
Admission: RE | Admit: 2019-11-05 | Discharge: 2019-11-05 | Disposition: A | Discharge status: Home / Self Care | Payer: Medicare Other | Source: Ambulatory Visit | Attending: Internal Medicine | Admitting: Internal Medicine

## 2019-11-05 DIAGNOSIS — I712 Thoracic aortic aneurysm, without rupture, unspecified: Secondary | ICD-10-CM

## 2019-11-05 LAB — POCT I-STAT CREATININE: Creatinine, Ser: 0.7 mg/dL (ref 0.44–1.00)

## 2019-11-05 MED ORDER — IOHEXOL 300 MG/ML  SOLN
100.0000 mL | Freq: Once | INTRAMUSCULAR | Status: AC | PRN
  Administered 2019-11-05: 100 mL via INTRAVENOUS

## 2019-11-12 ENCOUNTER — Telehealth: Payer: Self-pay | Admitting: Family Medicine

## 2019-11-12 NOTE — Telephone Encounter (Signed)
Copied from [place] [id number]. Topic: General - Call Back - No Documentation >> Nov 12, 2019  2:13 PM Erick Blinks wrote: Pt had testing done in at the [facility] clinic over a week ago and has not heard any results. Pt had an Ultrasounds of the carotid atery, her chest, pelvis; also CT scans.  Has concerns and wants to speak to a nurse, please advise  Best contact: [phone]

## 2019-11-12 NOTE — Telephone Encounter (Signed)
I called cardiology and left a message for them to call pt with results that they ordered.

## 2019-12-09 DIAGNOSIS — M722 Plantar fascial fibromatosis: Secondary | ICD-10-CM | POA: Diagnosis not present

## 2019-12-09 DIAGNOSIS — G609 Hereditary and idiopathic neuropathy, unspecified: Secondary | ICD-10-CM | POA: Diagnosis not present

## 2020-01-04 DIAGNOSIS — G609 Hereditary and idiopathic neuropathy, unspecified: Secondary | ICD-10-CM | POA: Diagnosis not present

## 2020-01-04 DIAGNOSIS — M722 Plantar fascial fibromatosis: Secondary | ICD-10-CM | POA: Diagnosis not present

## 2020-01-06 ENCOUNTER — Ambulatory Visit: Payer: Medicare Other

## 2020-01-13 ENCOUNTER — Ambulatory Visit (INDEPENDENT_AMBULATORY_CARE_PROVIDER_SITE_OTHER): Payer: Medicare Other

## 2020-01-13 DIAGNOSIS — Z0000 Encounter for general adult medical examination without abnormal findings: Secondary | ICD-10-CM | POA: Diagnosis not present

## 2020-01-13 NOTE — Patient Instructions (Signed)
Traci Bowen , Thank you for taking time to come for your Medicare Wellness Visit. I appreciate your ongoing commitment to your health goals. Please review the following plan we discussed and let me know if I can assist you in the future.   Screening recommendations/referrals: Colonoscopy: no longer required Mammogram: no longer required Bone Density: done 04/15/17 Recommended yearly ophthalmology/optometry visit for glaucoma screening and checkup Recommended yearly dental visit for hygiene and checkup  Vaccinations: Influenza vaccine: done 01/20/19 Pneumococcal vaccine: done 08/15/17  Tdap vaccine: done 04/30/16 Shingles vaccine: Shingrix discussed. Please contact your pharmacy for coverage information.    Covid-19:done 06/29/19 & 07/20/19  Advanced directives: Please bring a copy of your health care power of attorney and living will to the office at your convenience.  Conditions/risks identified: Keep up the great work!  Next appointment: Follow up in one year for your annual wellness visit    Preventive Care 65 Years and Older, Female Preventive care refers to lifestyle choices and visits with your health care provider that can promote health and wellness. What does preventive care include?  A yearly physical exam. This is also called an annual well check.  Dental exams once or twice a year.  Routine eye exams. Ask your health care provider how often you should have your eyes checked.  Personal lifestyle choices, including:  Daily care of your teeth and gums.  Regular physical activity.  Eating a healthy diet.  Avoiding tobacco and drug use.  Limiting alcohol use.  Practicing safe sex.  Taking low-dose aspirin every day.  Taking vitamin and mineral supplements as recommended by your health care provider. What happens during an annual well check? The services and screenings done by your health care provider during your annual well check will depend on your age,  overall health, lifestyle risk factors, and family history of disease. Counseling  Your health care provider may ask you questions about your:  Alcohol use.  Tobacco use.  Drug use.  Emotional well-being.  Home and relationship well-being.  Sexual activity.  Eating habits.  History of falls.  Memory and ability to understand (cognition).  Work and work Statistician.  Reproductive health. Screening  You may have the following tests or measurements:  Height, weight, and BMI.  Blood pressure.  Lipid and cholesterol levels. These may be checked every 5 years, or more frequently if you are over 82 years old.  Skin check.  Lung cancer screening. You may have this screening every year starting at age 78 if you have a 30-pack-year history of smoking and currently smoke or have quit within the past 15 years.  Fecal occult blood test (FOBT) of the stool. You may have this test every year starting at age 51.  Flexible sigmoidoscopy or colonoscopy. You may have a sigmoidoscopy every 5 years or a colonoscopy every 10 years starting at age 59.  Hepatitis C blood test.  Hepatitis B blood test.  Sexually transmitted disease (STD) testing.  Diabetes screening. This is done by checking your blood sugar (glucose) after you have not eaten for a while (fasting). You may have this done every 1-3 years.  Bone density scan. This is done to screen for osteoporosis. You may have this done starting at age 44.  Mammogram. This may be done every 1-2 years. Talk to your health care provider about how often you should have regular mammograms. Talk with your health care provider about your test results, treatment options, and if necessary, the need for more  tests. Vaccines  Your health care provider may recommend certain vaccines, such as:  Influenza vaccine. This is recommended every year.  Tetanus, diphtheria, and acellular pertussis (Tdap, Td) vaccine. You may need a Td booster every 10  years.  Zoster vaccine. You may need this after age 66.  Pneumococcal 13-valent conjugate (PCV13) vaccine. One dose is recommended after age 62.  Pneumococcal polysaccharide (PPSV23) vaccine. One dose is recommended after age 22. Talk to your health care provider about which screenings and vaccines you need and how often you need them. This information is not intended to replace advice given to you by your health care provider. Make sure you discuss any questions you have with your health care provider. Document Released: 06/10/2015 Document Revised: 02/01/2016 Document Reviewed: 03/15/2015 Elsevier Interactive Patient Education  2017 [place] Prevention in the Home Falls can cause injuries. They can happen to people of all ages. There are many things you can do to make your home safe and to help prevent falls. What can I do on the outside of my home?  Regularly fix the edges of walkways and driveways and fix any cracks.  Remove anything that might make you trip as you walk through a door, such as a raised step or threshold.  Trim any bushes or trees on the path to your home.  Use bright outdoor lighting.  Clear any walking paths of anything that might make someone trip, such as rocks or tools.  Regularly check to see if handrails are loose or broken. Make sure that both sides of any steps have handrails.  Any raised decks and porches should have guardrails on the edges.  Have any leaves, snow, or ice cleared regularly.  Use sand or salt on walking paths during winter.  Clean up any spills in your garage right away. This includes oil or grease spills. What can I do in the bathroom?  Use night lights.  Install grab bars by the toilet and in the tub and shower. Do not use towel bars as grab bars.  Use non-skid mats or decals in the tub or shower.  If you need to sit down in the shower, use a plastic, non-slip stool.  Keep the floor dry. Clean up any water that  spills on the floor as soon as it happens.  Remove soap buildup in the tub or shower regularly.  Attach bath mats securely with double-sided non-slip rug tape.  Do not have throw rugs and other things on the floor that can make you trip. What can I do in the bedroom?  Use night lights.  Make sure that you have a light by your bed that is easy to reach.  Do not use any sheets or blankets that are too big for your bed. They should not hang down onto the floor.  Have a firm chair that has side arms. You can use this for support while you get dressed.  Do not have throw rugs and other things on the floor that can make you trip. What can I do in the kitchen?  Clean up any spills right away.  Avoid walking on wet floors.  Keep items that you use a lot in easy-to-reach places.  If you need to reach something above you, use a strong step stool that has a grab bar.  Keep electrical cords out of the way.  Do not use floor polish or wax that makes floors slippery. If you must use wax, use non-skid floor  wax.  Do not have throw rugs and other things on the floor that can make you trip. What can I do with my stairs?  Do not leave any items on the stairs.  Make sure that there are handrails on both sides of the stairs and use them. Fix handrails that are broken or loose. Make sure that handrails are as long as the stairways.  Check any carpeting to make sure that it is firmly attached to the stairs. Fix any carpet that is loose or worn.  Avoid having throw rugs at the top or bottom of the stairs. If you do have throw rugs, attach them to the floor with carpet tape.  Make sure that you have a light switch at the top of the stairs and the bottom of the stairs. If you do not have them, ask someone to add them for you. What else can I do to help prevent falls?  Wear shoes that:  Do not have high heels.  Have rubber bottoms.  Are comfortable and fit you well.  Are closed at the  toe. Do not wear sandals.  If you use a stepladder:  Make sure that it is fully opened. Do not climb a closed stepladder.  Make sure that both sides of the stepladder are locked into place.  Ask someone to hold it for you, if possible.  Clearly mark and make sure that you can see:  Any grab bars or handrails.  First and last steps.  Where the edge of each step is.  Use tools that help you move around (mobility aids) if they are needed. These include:  Canes.  Walkers.  Scooters.  Crutches.  Turn on the lights when you go into a dark area. Replace any light bulbs as soon as they burn out.  Set up your furniture so you have a clear path. Avoid moving your furniture around.  If any of your floors are uneven, fix them.  If there are any pets around you, be aware of where they are.  Review your medicines with your doctor. Some medicines can make you feel dizzy. This can increase your chance of falling. Ask your doctor what other things that you can do to help prevent falls. This information is not intended to replace advice given to you by your health care provider. Make sure you discuss any questions you have with your health care provider. Document Released: 03/10/2009 Document Revised: 10/20/2015 Document Reviewed: 06/18/2014 Elsevier Interactive Patient Education  2017 [employer].

## 2020-01-13 NOTE — Progress Notes (Signed)
Subjective:   Traci Bowen is a 79 y.o. female who presents for Medicare Annual (Subsequent) preventive examination.  Virtual Visit via Telephone Note  I connected with  Traci Bowen on 01/13/20 at  8:00 AM EDT by telephone and verified that I am speaking with the correct person using two identifiers.  Medicare Annual Wellness visit completed telephonically due to Covid-19 pandemic.   Location: Patient: home Provider: [facility]   I discussed the limitations, risks, security and privacy concerns of performing an evaluation and management service by telephone and the availability of in person appointments. The patient expressed understanding and agreed to proceed.  Unable to perform video visit due to video visit attempted and failed and/or patient does not have video capability.   Some vital signs may be absent or patient reported.   Clemetine Marker, LPN    Review of Systems     Cardiac Risk Factors include: advanced age (>39men, >104 women);hypertension;dyslipidemia     Objective:    Today's Vitals   01/13/20 0822  PainSc: 1    There is no height or weight on file to calculate BMI.  Advanced Directives 01/13/2020 01/05/2019 08/15/2017 06/14/2017 02/10/2015  Does Patient Have a Medical Advance Directive? Yes Yes Yes Yes No;Yes  Type of Paramedic of [place];Living will Healthcare Power of [place];Living will Healthcare Power of Attorney Living will  Copy of [place] in Chart? No - copy requested No - copy requested No - copy requested No - copy requested -  Would patient like information on creating a medical advance directive? - - - - No - patient declined information    Current Medications (verified) Outpatient Encounter Medications as of 01/13/2020  Medication Sig  . amLODipine (NORVASC) 5 MG tablet TAKE (1) TABLET BY MOUTH EVERY DAY  . aspirin 81 MG EC tablet Take 1 tablet by mouth daily.  .  calcium carbonate (OSCAL) 1500 (600 Ca) MG TABS tablet Take 1 tablet by mouth daily.  . Cholecalciferol (VITAMIN D) 2000 units tablet Take by mouth.  . gabapentin (NEURONTIN) 300 MG capsule TAKE (1) CAPSULE BY MOUTH AT BEDTIME  . losartan (COZAAR) 25 MG tablet Take 1 tablet (25 mg total) by mouth daily.  . Multiple Vitamins-Minerals (CENTRUM SILVER PO) Take 1 capsule by mouth daily at 6 (six) AM.  . Omega-3 Fatty Acids (FISH OIL) 1000 MG CAPS Take 1 capsule (1,000 mg total) by mouth 2 (two) times daily.  . simvastatin (ZOCOR) 40 MG tablet Take 1 tablet (40 mg total) by mouth at bedtime.  . triamcinolone cream (KENALOG) 0.1 % Apply 1 application topically 2 (two) times daily.  . vitamin B-12 (CYANOCOBALAMIN) 1000 MCG tablet Take 1,000 mcg by mouth daily.  . Zinc 100 MG TABS Take 1 tablet by mouth daily.  . nitroGLYCERIN (NITROSTAT) 0.4 MG SL tablet Place one tablet under the tongue every 5 minutes as needed for chest pain. May take up to 3 doses.  . [DISCONTINUED] albuterol (VENTOLIN HFA) 108 (90 Base) MCG/ACT inhaler Inhale 2 puffs into the lungs every 6 (six) hours as needed for wheezing or shortness of breath. (Patient not taking: Reported on 10/01/2019)   No facility-administered encounter medications on file as of 01/13/2020.    Allergies (verified) Betadine [povidone iodine] and Penicillins   History: Past Medical History:  Diagnosis Date  . Hyperlipidemia   . Hypertension   . Neuropathy   . Renal disorder   . Spinal stenosis  Past Surgical History:  Procedure Laterality Date  . CATARACT EXTRACTION Bilateral   . EYE SURGERY Left march 2016   cysts removed   . LITHOTRIPSY    . VAGINAL HYSTERECTOMY     Family History  Problem Relation Age of Onset  . Diabetes Mother   . Hypertension Mother   . Cancer Father   . Cancer Brother   . Stroke Sister   . Heart attack Brother    Social History   Socioeconomic History  . Marital status: Married    Spouse name: Not on file    . Number of children: 2  . Years of education: 1 year college  . Highest education level: 12th grade  Occupational History  . Occupation: Retired  Tobacco Use  . Smoking status: Former Smoker    Packs/day: 0.50    Years: 3.00    Pack years: 1.50    Types: Cigarettes    Quit date: 1989    Years since quitting: 32.6  . Smokeless tobacco: Never Used  . Tobacco comment: smoking cessation materials not required  Vaping Use  . Vaping Use: Never used  Substance and Sexual Activity  . Alcohol use: No    Alcohol/week: 0.0 standard drinks  . Drug use: No  . Sexual activity: Never  Other Topics Concern  . Not on file  Social History Narrative  . Not on file   Social Determinants of Health   Financial Resource Strain: Low Risk   . Difficulty of Paying Living Expenses: Not hard at all  Food Insecurity: No Food Insecurity  . Worried About Charity fundraiser in the Last Year: Never true  . Ran Out of Food in the Last Year: Never true  Transportation Needs: No Transportation Needs  . Lack of Transportation (Medical): No  . Lack of Transportation (Non-Medical): No  Physical Activity: Insufficiently Active  . Days of Exercise per Week: 7 days  . Minutes of Exercise per Session: 10 min  Stress: No Stress Concern Present  . Feeling of Stress : Not at all  Social Connections: Unknown  . Frequency of Communication with Friends and Family: Patient refused  . Frequency of Social Gatherings with Friends and Family: Patient refused  . Attends Religious Services: Patient refused  . Active Member of Clubs or Organizations: Patient refused  . Attends Archivist Meetings: Patient refused  . Marital Status: Married    Tobacco Counseling Counseling given: Not Answered Comment: smoking cessation materials not required   Clinical Intake:  Pre-visit preparation completed: Yes  Pain : 0-10 Pain Score: 1  Pain Type: Chronic pain Pain Location: Foot Pain Orientation:  Right Pain Descriptors / Indicators: Sore, Aching Pain Onset: More than a month ago Pain Frequency: Constant     Nutritional Risks: None Diabetes: No  How often do you need to have someone help you when you read instructions, pamphlets, or other written materials from your doctor or pharmacy?: 1 - Never    Interpreter Needed?: No  Information entered by :: Clemetine Marker LPN   Activities of Daily Living In your present state of health, do you have any difficulty performing the following activities: 01/13/2020  Hearing? Y  Vision? Y  Difficulty concentrating or making decisions? N  Walking or climbing stairs? N  Dressing or bathing? N  Doing errands, shopping? N  Preparing Food and eating ? N  Using the Toilet? N  In the past six months, have you accidently leaked urine? N  Do  you have problems with loss of bowel control? N  Managing your Medications? N  Managing your Finances? N  Housekeeping or managing your Housekeeping? N  Some recent data might be hidden    Patient Care Team: Juline Patch, MD as PCP - General (Family Medicine)  Indicate any recent Medical Services you may have received from other than [facility] providers in the past year (date may be approximate).     Assessment:   This is a routine wellness examination for [place].  Hearing/Vision screen  Hearing Screening   125Hz  250Hz  500Hz  1000Hz  2000Hz  3000Hz  4000Hz  6000Hz  8000Hz   Right ear:           Left ear:           Comments: Pt wears hearing aids when needed  Vision Screening Comments: Annual vision screenings with Dr. Nicole Kindred in [place] issues and exercise activities discussed: Current Exercise Habits: Home exercise routine, Type of exercise: Other - see comments (vpx 2000), Time (Minutes): 10, Frequency (Times/Week): 7, Weekly Exercise (Minutes/Week): 70, Intensity: Mild, Exercise limited by: neurologic condition(s)  Goals    . DIET - INCREASE WATER INTAKE     Recommend to drink at  least 6-8 8oz glasses of water per day.    . Increase physical activity     Recommend increasing physical activity to at least 3 days per week      Depression Screen [facility] 2/9 Scores 01/13/2020 10/01/2019 09/01/2019 01/05/2019 09/25/2018 01/13/2018 08/15/2017  PHQ - 2 Score 0 0 0 0 0 0 0  PHQ- 9 Score - 0 0 - 0 0 0    Fall Risk Fall Risk  01/13/2020 10/01/2019 09/01/2019 01/05/2019 07/25/2018  Falls in the past year? 0 0 0 1 0  Comment - - - - -  Number falls in past yr: 0 - - 1 0  Comment - - - tripped in the yard -  Injury with Fall? 0 - - 0 0  Risk for fall due to : No Fall Risks - - - -  Follow up Falls prevention discussed Falls evaluation completed Falls evaluation completed Falls prevention discussed -    Any stairs in or around the home? No  If so, are there any without handrails? No  Home free of loose throw rugs in walkways, pet beds, electrical cords, etc? Yes  Adequate lighting in your home to reduce risk of falls? Yes   ASSISTIVE DEVICES UTILIZED TO PREVENT FALLS:  Life alert? No  Use of a cane, walker or w/c? No  Grab bars in the bathroom? Yes  Shower chair or bench in shower? Yes  Elevated toilet seat or a handicapped toilet? No   TIMED UP AND GO:  Was the test performed? No . Telephonic visit.   Cognitive Function:     6CIT Screen 01/13/2020 08/15/2017  What Year? 0 points 0 points  What month? 0 points 0 points  What time? 0 points 0 points  Count back from 20 0 points 0 points  Months in reverse 0 points 0 points  Repeat phrase 0 points 2 points  Total Score 0 2    Immunizations Immunization History  Administered Date(s) Administered  . Fluad Quad(high Dose 65+) 01/20/2019  . Influenza,inj,Quad PF,6+ Mos 02/13/2017  . Influenza-Unspecified 03/16/2016, 03/28/2018  . PFIZER SARS-COV-2 Vaccination 06/29/2019, 07/20/2019  . Pneumococcal Conjugate-13 04/30/2016  . Pneumococcal Polysaccharide-23 08/15/2017  . Tdap 04/30/2016    TDAP status: Up to date   Flu  Vaccine  status: Up to date   Pneumococcal vaccine status: Up to date   Covid-19 vaccine status: Completed vaccines  Qualifies for Shingles Vaccine? Yes   Zostavax completed No   Shingrix Completed?: No.    Education has been provided regarding the importance of this vaccine. Patient has been advised to call insurance company to determine out of pocket expense if they have not yet received this vaccine. Advised may also receive vaccine at local pharmacy or Health Dept. Verbalized acceptance and understanding.  Screening Tests Health Maintenance  Topic Date Due  . Hepatitis C Screening  Never done  . INFLUENZA VACCINE  12/27/2019  . TETANUS/TDAP  04/30/2026  . DEXA SCAN  Completed  . COVID-19 Vaccine  Completed  . PNA vac Low Risk Adult  Completed    Health Maintenance  Health Maintenance Due  Topic Date Due  . Hepatitis C Screening  Never done  . INFLUENZA VACCINE  12/27/2019    Colorectal cancer screening: No longer required.    Mammogram status: No longer required.    Bone Density status: Completed 04/15/17. Results reflect: Bone density results: OSTEOPOROSIS. Repeat every 2 years.  Lung Cancer Screening: (Low Dose CT Chest recommended if Age 56-80 years, 30 pack-year currently smoking OR have quit w/in 15years.) does not qualify.   Additional Screening:  Hepatitis C Screening: does qualify; postponed  Vision Screening: Recommended annual ophthalmology exams for early detection of glaucoma and other disorders of the eye. Is the patient up to date with their annual eye exam?  Yes  Who is the provider or what is the name of the office in which the patient attends annual eye exams? Dr. Nicole Kindred   Dental Screening: Recommended annual dental exams for proper oral hygiene  Community Resource Referral / Chronic Care Management: CRR required this visit?  No   CCM required this visit?  No      Plan:     I have personally reviewed and noted the following in the  patient's chart:   . Medical and social history . Use of alcohol, tobacco or illicit drugs  . Current medications and supplements . Functional ability and status . Nutritional status . Physical activity . Advanced directives . List of other physicians . Hospitalizations, surgeries, and ER visits in previous 12 months . Vitals . Screenings to include cognitive, depression, and falls . Referrals and appointments  In addition, I have reviewed and discussed with patient certain preventive protocols, quality metrics, and best practice recommendations. A written personalized care plan for preventive services as well as general preventive health recommendations were provided to patient.   Due to this being a telephonic visit, the after visit summary with patients personalized plan was offered to patient via mail or my-chart. Patient declined at this time  Clemetine Marker, LPN   01/24/9406   Nurse Notes: none

## 2020-02-09 DIAGNOSIS — H348312 Tributary (branch) retinal vein occlusion, right eye, stable: Secondary | ICD-10-CM | POA: Diagnosis not present

## 2020-02-10 DIAGNOSIS — H35033 Hypertensive retinopathy, bilateral: Secondary | ICD-10-CM | POA: Diagnosis not present

## 2020-02-10 DIAGNOSIS — H34831 Tributary (branch) retinal vein occlusion, right eye, with macular edema: Secondary | ICD-10-CM | POA: Diagnosis not present

## 2020-02-16 ENCOUNTER — Telehealth: Payer: Self-pay

## 2020-02-16 NOTE — Chronic Care Management (AMB) (Signed)
  Chronic Care Management   Note  02/16/2020 Name: Traci Bowen MRN: [id number] DOB: 05-22-1941  [name] is a 79 y.o. year old female who is a primary care patient of Juline Patch, MD. I reached out to Murvin Natal by phone today in response to a referral sent by Ms. Jackie Plum Allison's health plan.     Ms. Shawnee Gambone was given information about Chronic Care Management services today including:  1. CCM service includes personalized support from designated clinical staff supervised by her physician, including individualized plan of care and coordination with other care providers 2. 24/7 contact phone numbers for assistance for urgent and routine care needs. 3. Service will only be billed when office clinical staff spend 20 minutes or more in a month to coordinate care. 4. Only one practitioner may furnish and bill the service in a calendar month. 5. The patient may stop CCM services at any time (effective at the end of the month) by phone call to the office staff. 6. The patient will be responsible for cost sharing (co-pay) of up to 20% of the service fee (after annual deductible is met).  Patient did not agree to enrollment in care management services and does not wish to consider at this time.  Follow up plan: The patient has been provided with contact information for the care management team and has been advised to call with any health related questions or concerns.   Noreene Larsson, [place], [place], [place] [address] Direct Dial: [phone] [email or web address]

## 2020-02-24 ENCOUNTER — Other Ambulatory Visit: Payer: Self-pay | Admitting: Family Medicine

## 2020-02-24 DIAGNOSIS — I10 Essential (primary) hypertension: Secondary | ICD-10-CM

## 2020-02-24 DIAGNOSIS — M48061 Spinal stenosis, lumbar region without neurogenic claudication: Secondary | ICD-10-CM

## 2020-03-09 DIAGNOSIS — H34831 Tributary (branch) retinal vein occlusion, right eye, with macular edema: Secondary | ICD-10-CM | POA: Diagnosis not present

## 2020-03-09 DIAGNOSIS — H35033 Hypertensive retinopathy, bilateral: Secondary | ICD-10-CM | POA: Diagnosis not present

## 2020-03-14 ENCOUNTER — Ambulatory Visit (INDEPENDENT_AMBULATORY_CARE_PROVIDER_SITE_OTHER): Payer: Medicare Other

## 2020-03-14 ENCOUNTER — Other Ambulatory Visit: Payer: Self-pay

## 2020-03-14 DIAGNOSIS — Z23 Encounter for immunization: Secondary | ICD-10-CM | POA: Diagnosis not present

## 2020-04-05 ENCOUNTER — Other Ambulatory Visit: Payer: Self-pay

## 2020-04-05 ENCOUNTER — Encounter: Payer: Self-pay | Admitting: Family Medicine

## 2020-04-05 ENCOUNTER — Ambulatory Visit (INDEPENDENT_AMBULATORY_CARE_PROVIDER_SITE_OTHER): Payer: Medicare Other | Admitting: Family Medicine

## 2020-04-05 VITALS — BP 140/100 | HR 72 | Ht 65.0 in | Wt 180.0 lb

## 2020-04-05 DIAGNOSIS — N3090 Cystitis, unspecified without hematuria: Secondary | ICD-10-CM | POA: Diagnosis not present

## 2020-04-05 DIAGNOSIS — E782 Mixed hyperlipidemia: Secondary | ICD-10-CM

## 2020-04-05 DIAGNOSIS — I10 Essential (primary) hypertension: Secondary | ICD-10-CM | POA: Diagnosis not present

## 2020-04-05 LAB — POCT URINALYSIS DIPSTICK
Bilirubin, UA: NEGATIVE
Blood, UA: POSITIVE
Glucose, UA: NEGATIVE
Ketones, UA: NEGATIVE
Nitrite, UA: NEGATIVE
Protein, UA: NEGATIVE
Spec Grav, UA: 1.02 (ref 1.010–1.025)
Urobilinogen, UA: 0.2 E.U./dL
pH, UA: 6 (ref 5.0–8.0)

## 2020-04-05 MED ORDER — AMLODIPINE BESYLATE 5 MG PO TABS: ORAL_TABLET | ORAL | 1 refills | Status: AC

## 2020-04-05 MED ORDER — SIMVASTATIN 40 MG PO TABS: 40.0000 mg | ORAL_TABLET | Freq: Every day | ORAL | 1 refills | Status: DC

## 2020-04-05 MED ORDER — NITROFURANTOIN MONOHYD MACRO 100 MG PO CAPS: 100.0000 mg | ORAL_CAPSULE | Freq: Two times a day (BID) | ORAL | 0 refills | Status: DC

## 2020-04-05 MED ORDER — LOSARTAN POTASSIUM 25 MG PO TABS: 25.0000 mg | ORAL_TABLET | Freq: Every day | ORAL | 1 refills | Status: DC

## 2020-04-05 NOTE — Progress Notes (Signed)
Date:  04/05/2020   Name:  Traci Bowen   DOB:  Jun 27, 1940   MRN:  [id number]   Chief Complaint: Hypertension, Hyperlipidemia, and Urinary Tract Infection (burning when urinates)  Hypertension This is a chronic problem. The current episode started more than 1 year ago. The problem is unchanged. The problem is controlled. Pertinent negatives include no anxiety, blurred vision, chest pain, headaches, malaise/fatigue, neck pain, orthopnea, palpitations, peripheral edema, PND, shortness of breath or sweats. There are no associated agents to hypertension. There are no known risk factors for coronary artery disease. Past treatments include angiotensin blockers and calcium channel blockers. The current treatment provides mild (no medication today) improvement. There are no compliance problems.  Hypertensive end-organ damage includes CVA. There is no history of angina, kidney disease, CAD/MI, heart failure, left ventricular hypertrophy, PVD or retinopathy. There is no history of chronic renal disease, a hypertension causing med or renovascular disease.  Hyperlipidemia This is a chronic problem. The current episode started more than 1 year ago. The problem is controlled. Recent lipid tests were reviewed and are normal. She has no history of chronic renal disease, diabetes, hypothyroidism, liver disease, obesity or nephrotic syndrome. Pertinent negatives include no chest pain, focal sensory loss, focal weakness, leg pain, myalgias or shortness of breath. Current antihyperlipidemic treatment includes statins. The current treatment provides moderate improvement of lipids. There are no compliance problems.  Risk factors for coronary artery disease include hypertension and dyslipidemia.  Urinary Tract Infection  This is a new problem. The current episode started today. The problem occurs every urination. The problem has been gradually improving. The quality of the pain is described as burning. The pain is  mild. Pertinent negatives include no chills, discharge, flank pain, frequency, hematuria, hesitancy, nausea, sweats, urgency or vomiting. The treatment provided moderate relief.    Lab Results  Component Value Date   CREATININE 0.70 11/05/2019   BUN 10 10/01/2019   NA 145 (H) 10/01/2019   K 4.0 10/01/2019   CL 101 10/01/2019   CO2 29 10/01/2019   Lab Results  Component Value Date   CHOL 175 10/01/2019   HDL 40 10/01/2019   LDLCALC 101 (H) 10/01/2019   TRIG 194 (H) 10/01/2019   CHOLHDL 3.5 01/13/2018   No results found for: TSH No results found for: HGBA1C Lab Results  Component Value Date   WBC 8.8 06/14/2017   HGB 15.6 06/14/2017   HCT 45.6 06/14/2017   MCV 86.6 06/14/2017   PLT 279 06/14/2017   Lab Results  Component Value Date   ALT 24 10/01/2019   AST 27 10/01/2019   ALKPHOS 64 10/01/2019   BILITOT 0.4 10/01/2019     Review of Systems  Constitutional: Negative.  Negative for chills, fatigue, fever, malaise/fatigue and unexpected weight change.  HENT: Negative for congestion, ear discharge, ear pain, rhinorrhea, sinus pressure, sneezing and sore throat.   Eyes: Negative for blurred vision, photophobia, pain, discharge, redness and itching.  Respiratory: Negative for cough, shortness of breath, wheezing and stridor.   Cardiovascular: Negative for chest pain, palpitations, orthopnea and PND.  Gastrointestinal: Negative for abdominal pain, blood in stool, constipation, diarrhea, nausea and vomiting.  Endocrine: Negative for cold intolerance, heat intolerance, polydipsia, polyphagia and polyuria.  Genitourinary: Negative for dysuria, flank pain, frequency, hematuria, hesitancy, menstrual problem, pelvic pain, urgency, vaginal bleeding and vaginal discharge.  Musculoskeletal: Negative for arthralgias, back pain, myalgias and neck pain.  Skin: Negative for rash.  Allergic/Immunologic: Negative for environmental allergies and food  allergies.  Neurological: Negative for  dizziness, focal weakness, weakness, light-headedness, numbness and headaches.  Hematological: Negative for adenopathy. Does not bruise/bleed easily.  Psychiatric/Behavioral: Negative for dysphoric mood. The patient is not nervous/anxious.     Patient Active Problem List   Diagnosis Date Noted  . Sebaceous cyst 07/25/2018  . Neuropathy 01/06/2016  . Essential hypertension 06/01/2015  . Hyperlipidemia 06/01/2015    Allergies  Allergen Reactions  . Betadine [Povidone Iodine]   . Penicillins     Past Surgical History:  Procedure Laterality Date  . CATARACT EXTRACTION Bilateral   . EYE SURGERY Left march 2016   cysts removed   . LITHOTRIPSY    . VAGINAL HYSTERECTOMY      Social History   Tobacco Use  . Smoking status: Former Smoker    Packs/day: 0.50    Years: 3.00    Pack years: 1.50    Types: Cigarettes    Quit date: 1989    Years since quitting: 32.8  . Smokeless tobacco: Never Used  . Tobacco comment: smoking cessation materials not required  Vaping Use  . Vaping Use: Never used  Substance Use Topics  . Alcohol use: No    Alcohol/week: 0.0 standard drinks  . Drug use: No     Medication list has been reviewed and updated.  Current Meds  Medication Sig  . amLODipine (NORVASC) 5 MG tablet TAKE (1) TABLET BY MOUTH EVERY DAY  . aspirin 81 MG EC tablet Take 1 tablet by mouth daily.  . calcium carbonate (OSCAL) 1500 (600 Ca) MG TABS tablet Take 1 tablet by mouth daily.  . Cholecalciferol (VITAMIN D) 2000 units tablet Take by mouth.  . gabapentin (NEURONTIN) 300 MG capsule TAKE (1) CAPSULE BY MOUTH AT BEDTIME  . losartan (COZAAR) 25 MG tablet Take 1 tablet (25 mg total) by mouth daily.  . Multiple Vitamins-Minerals (CENTRUM SILVER PO) Take 1 capsule by mouth daily at 6 (six) AM.  . nitroGLYCERIN (NITROSTAT) 0.4 MG SL tablet Place one tablet under the tongue every 5 minutes as needed for chest pain. May take up to 3 doses.  . Omega-3 Fatty Acids (FISH OIL) 1000 MG  CAPS Take 1 capsule (1,000 mg total) by mouth 2 (two) times daily.  . simvastatin (ZOCOR) 40 MG tablet Take 1 tablet (40 mg total) by mouth at bedtime.  . triamcinolone cream (KENALOG) 0.1 % Apply 1 application topically 2 (two) times daily.  . vitamin B-12 (CYANOCOBALAMIN) 1000 MCG tablet Take 1,000 mcg by mouth daily.    PHQ 2/9 Scores 04/05/2020 01/13/2020 10/01/2019 09/01/2019  PHQ - 2 Score 0 0 0 0  PHQ- 9 Score 0 - 0 0    GAD 7 : Generalized Anxiety Score 04/05/2020 10/01/2019 09/01/2019  Nervous, Anxious, on Edge 0 0 0  Control/stop worrying 0 0 0  Worry too much - different things 0 0 0  Trouble relaxing 0 0 0  Restless 0 0 0  Easily annoyed or irritable 0 0 0  Afraid - awful might happen 0 0 0  Total GAD 7 Score 0 0 0    BP Readings from Last 3 Encounters:  04/05/20 (!) 140/100  10/01/19 130/80  09/01/19 140/80    Physical Exam Vitals and nursing note reviewed.  Constitutional:      Appearance: She is well-developed.  HENT:     Head: Normocephalic.     Right Ear: Tympanic membrane and external ear normal.     Left Ear: Tympanic membrane and external  ear normal.  Eyes:     General: Lids are everted, no foreign bodies appreciated. No scleral icterus.       Left eye: No foreign body or hordeolum.     Conjunctiva/sclera: Conjunctivae normal.     Right eye: Right conjunctiva is not injected.     Left eye: Left conjunctiva is not injected.     Pupils: Pupils are equal, round, and reactive to light.  Neck:     Thyroid: No thyromegaly.     Vascular: No JVD.     Trachea: No tracheal deviation.  Cardiovascular:     Rate and Rhythm: Normal rate and regular rhythm.     Heart sounds: Normal heart sounds. No murmur heard.  No friction rub. No gallop.   Pulmonary:     Effort: Pulmonary effort is normal. No respiratory distress.     Breath sounds: Normal breath sounds. No wheezing or rales.  Abdominal:     General: Bowel sounds are normal.     Palpations: Abdomen is soft. There  is no mass.     Tenderness: There is abdominal tenderness in the suprapubic area. There is no guarding or rebound.  Musculoskeletal:        General: No tenderness. Normal range of motion.     Cervical back: Normal range of motion and neck supple.  Lymphadenopathy:     Cervical: No cervical adenopathy.  Skin:    General: Skin is warm.     Findings: No rash.  Neurological:     Mental Status: She is alert and oriented to person, place, and time.     Cranial Nerves: No cranial nerve deficit.     Deep Tendon Reflexes: Reflexes normal.  Psychiatric:        Mood and Affect: Mood is not anxious or depressed.     Wt Readings from Last 3 Encounters:  04/05/20 180 lb (81.6 kg)  10/01/19 171 lb (77.6 kg)  09/01/19 171 lb (77.6 kg)    BP (!) 140/100   Pulse 72   Ht 5\' 5"  (1.651 m)   Wt 180 lb (81.6 kg)   BMI 29.95 kg/m   Assessment and Plan:  1. Essential hypertension Chronic.  Controlled.  However patient did not take her blood pressure medicine because of being rushed upon leaving.  Stable.  Will continue amlodipine 5 mg daily as well as losartan 25 mg daily. - amLODipine (NORVASC) 5 MG tablet; 1 tablet daily  Dispense: 90 tablet; Refill: 1 - losartan (COZAAR) 25 MG tablet; Take 1 tablet (25 mg total) by mouth daily.  Dispense: 90 tablet; Refill: 1  2. Mixed hyperlipidemia Chronic.  Controlled.  Stable.  We will continue simvastatin 40 mg once a day. - simvastatin (ZOCOR) 40 MG tablet; Take 1 tablet (40 mg total) by mouth at bedtime.  Dispense: 90 tablet; Refill: 1  3. Cystitis New onset.  Persistent.  Stable.  Patient with suprapubic discomfort.  Urinalysis has suggestions of hematuria and leukocytosis.  Will treat with Macrobid 1 twice a day for 3 days.  Will recheck as needed. - POCT urinalysis dipstick - nitrofurantoin, macrocrystal-monohydrate, (MACROBID) 100 MG capsule; Take 1 capsule (100 mg total) by mouth 2 (two) times daily.  Dispense: 6 capsule; Refill: 0

## 2020-04-07 DIAGNOSIS — H34831 Tributary (branch) retinal vein occlusion, right eye, with macular edema: Secondary | ICD-10-CM | POA: Diagnosis not present

## 2020-04-07 DIAGNOSIS — H35033 Hypertensive retinopathy, bilateral: Secondary | ICD-10-CM | POA: Diagnosis not present

## 2020-04-18 DIAGNOSIS — I712 Thoracic aortic aneurysm, without rupture: Secondary | ICD-10-CM | POA: Diagnosis not present

## 2020-04-18 DIAGNOSIS — I10 Essential (primary) hypertension: Secondary | ICD-10-CM | POA: Diagnosis not present

## 2020-04-18 DIAGNOSIS — J449 Chronic obstructive pulmonary disease, unspecified: Secondary | ICD-10-CM | POA: Diagnosis not present

## 2020-04-18 DIAGNOSIS — I6523 Occlusion and stenosis of bilateral carotid arteries: Secondary | ICD-10-CM | POA: Diagnosis not present

## 2020-04-18 DIAGNOSIS — I208 Other forms of angina pectoris: Secondary | ICD-10-CM | POA: Diagnosis not present

## 2020-04-18 DIAGNOSIS — I7090 Unspecified atherosclerosis: Secondary | ICD-10-CM | POA: Diagnosis not present

## 2020-04-18 DIAGNOSIS — R011 Cardiac murmur, unspecified: Secondary | ICD-10-CM | POA: Diagnosis not present

## 2020-04-18 DIAGNOSIS — E782 Mixed hyperlipidemia: Secondary | ICD-10-CM | POA: Diagnosis not present

## 2020-05-10 DIAGNOSIS — H34831 Tributary (branch) retinal vein occlusion, right eye, with macular edema: Secondary | ICD-10-CM | POA: Diagnosis not present

## 2020-05-10 DIAGNOSIS — H35033 Hypertensive retinopathy, bilateral: Secondary | ICD-10-CM | POA: Diagnosis not present

## 2020-06-07 ENCOUNTER — Ambulatory Visit (INDEPENDENT_AMBULATORY_CARE_PROVIDER_SITE_OTHER): Payer: Medicare Other | Admitting: Family Medicine

## 2020-06-07 ENCOUNTER — Other Ambulatory Visit: Payer: Self-pay

## 2020-06-07 ENCOUNTER — Encounter: Payer: Self-pay | Admitting: Family Medicine

## 2020-06-07 VITALS — BP 130/80 | HR 64 | Ht 65.0 in | Wt 179.0 lb

## 2020-06-07 DIAGNOSIS — M1612 Unilateral primary osteoarthritis, left hip: Secondary | ICD-10-CM

## 2020-06-07 NOTE — Progress Notes (Signed)
Date:  06/07/2020   Name:  Traci Bowen   DOB:  Nov 24, 1940   MRN:  [id number]   Chief Complaint: Abdominal Pain (LLQ- had an injection in back- is it related)  Hip Pain  The incident occurred 5 to 7 days ago (onset of pain). There was no injury mechanism. The pain is present in the left hip. The quality of the pain is described as aching. The pain is at a severity of 7/10. The pain is moderate. The pain has been fluctuating since onset. Pertinent negatives include no numbness. The symptoms are aggravated by movement, weight bearing and palpation. She has tried NSAIDs and acetaminophen for the symptoms. The treatment provided moderate relief.    Lab Results  Component Value Date   CREATININE 0.70 11/05/2019   BUN 10 10/01/2019   NA 145 (H) 10/01/2019   K 4.0 10/01/2019   CL 101 10/01/2019   CO2 29 10/01/2019   Lab Results  Component Value Date   CHOL 175 10/01/2019   HDL 40 10/01/2019   LDLCALC 101 (H) 10/01/2019   TRIG 194 (H) 10/01/2019   CHOLHDL 3.5 01/13/2018   No results found for: TSH No results found for: HGBA1C Lab Results  Component Value Date   WBC 8.8 06/14/2017   HGB 15.6 06/14/2017   HCT 45.6 06/14/2017   MCV 86.6 06/14/2017   PLT 279 06/14/2017   Lab Results  Component Value Date   ALT 24 10/01/2019   AST 27 10/01/2019   ALKPHOS 64 10/01/2019   BILITOT 0.4 10/01/2019     Review of Systems  Constitutional: Negative.  Negative for chills, fatigue, fever and unexpected weight change.  HENT: Negative for congestion, ear discharge, ear pain, rhinorrhea, sinus pressure, sneezing and sore throat.   Eyes: Negative for double vision, photophobia, pain, discharge, redness and itching.  Respiratory: Negative for cough, shortness of breath, wheezing and stridor.   Gastrointestinal: Negative for abdominal pain, blood in stool, constipation, diarrhea, nausea and vomiting.  Endocrine: Negative for cold intolerance, heat intolerance, polydipsia, polyphagia  and polyuria.  Genitourinary: Negative for dysuria, flank pain, frequency, hematuria, menstrual problem, pelvic pain, urgency, vaginal bleeding and vaginal discharge.  Musculoskeletal: Negative for arthralgias, back pain and myalgias.  Skin: Negative for rash.  Allergic/Immunologic: Negative for environmental allergies and food allergies.  Neurological: Negative for dizziness, weakness, light-headedness, numbness and headaches.  Hematological: Negative for adenopathy. Does not bruise/bleed easily.  Psychiatric/Behavioral: Negative for dysphoric mood. The patient is not nervous/anxious.     Patient Active Problem List   Diagnosis Date Noted  . Sebaceous cyst 07/25/2018  . Neuropathy 01/06/2016  . Essential hypertension 06/01/2015  . Hyperlipidemia 06/01/2015    Allergies  Allergen Reactions  . Betadine [Povidone Iodine]   . Penicillins     Past Surgical History:  Procedure Laterality Date  . CATARACT EXTRACTION Bilateral   . EYE SURGERY Left march 2016   cysts removed   . LITHOTRIPSY    . VAGINAL HYSTERECTOMY      Social History   Tobacco Use  . Smoking status: Former Smoker    Packs/day: 0.50    Years: 3.00    Pack years: 1.50    Types: Cigarettes    Quit date: 1989    Years since quitting: 33.0  . Smokeless tobacco: Never Used  . Tobacco comment: smoking cessation materials not required  Vaping Use  . Vaping Use: Never used  Substance Use Topics  . Alcohol use: No  Alcohol/week: 0.0 standard drinks  . Drug use: No     Medication list has been reviewed and updated.  No outpatient medications have been marked as taking for the 06/07/20 encounter (Office Visit) with Juline Patch, MD.    [facility] 2/9 Scores 04/05/2020 01/13/2020 10/01/2019 09/01/2019  PHQ - 2 Score 0 0 0 0  PHQ- 9 Score 0 - 0 0    GAD 7 : Generalized Anxiety Score 04/05/2020 10/01/2019 09/01/2019  Nervous, Anxious, on Edge 0 0 0  Control/stop worrying 0 0 0  Worry too much - different things 0 0 0   Trouble relaxing 0 0 0  Restless 0 0 0  Easily annoyed or irritable 0 0 0  Afraid - awful might happen 0 0 0  Total GAD 7 Score 0 0 0    BP Readings from Last 3 Encounters:  06/07/20 130/80  04/05/20 (!) 140/100  10/01/19 130/80    Physical Exam Vitals and nursing note reviewed.  Constitutional:      Appearance: She is well-developed and well-nourished.  HENT:     Head: Normocephalic.     Right Ear: External ear normal.     Left Ear: External ear normal.     Mouth/Throat:     Mouth: Oropharynx is clear and moist.  Eyes:     General: Lids are everted, no foreign bodies appreciated. No scleral icterus.       Left eye: No foreign body or hordeolum.     Extraocular Movements: EOM normal.     Conjunctiva/sclera: Conjunctivae normal.     Right eye: Right conjunctiva is not injected.     Left eye: Left conjunctiva is not injected.     Pupils: Pupils are equal, round, and reactive to light.  Neck:     Thyroid: No thyromegaly.     Vascular: No JVD.     Trachea: No tracheal deviation.  Cardiovascular:     Rate and Rhythm: Normal rate and regular rhythm.     Pulses: Intact distal pulses.     Heart sounds: Normal heart sounds. No murmur heard. No friction rub. No gallop.   Pulmonary:     Effort: Pulmonary effort is normal. No respiratory distress.     Breath sounds: Normal breath sounds. No wheezing, rhonchi or rales.  Abdominal:     General: Bowel sounds are normal.     Palpations: Abdomen is soft. There is no hepatomegaly, splenomegaly, hepatosplenomegaly or mass.     Tenderness: There is no abdominal tenderness. There is no right CVA tenderness, left CVA tenderness, guarding or rebound.     Hernia: There is no hernia in the umbilical area, ventral area, left inguinal area, right femoral area, left femoral area or right inguinal area.  Musculoskeletal:        General: No edema.     Cervical back: Normal range of motion and neck supple.     Left hip: Tenderness and bony  tenderness present. Decreased range of motion.     Comments: Pain with internal/external rotation  Lymphadenopathy:     Cervical: No cervical adenopathy.  Skin:    General: Skin is warm.     Findings: No rash.  Neurological:     Mental Status: She is alert and oriented to person, place, and time.     Cranial Nerves: No cranial nerve deficit.     Deep Tendon Reflexes: Strength normal. Reflexes normal.  Psychiatric:        Mood and Affect: Mood  and affect normal. Mood is not anxious or depressed.     Wt Readings from Last 3 Encounters:  06/07/20 179 lb (81.2 kg)  04/05/20 180 lb (81.6 kg)  10/01/19 171 lb (77.6 kg)    BP 130/80   Pulse 64   Ht 5\' 5"  (1.651 m)   Wt 179 lb (81.2 kg)   BMI 29.79 kg/m   Assessment and Plan: 1. Arthropathy of left hip New onset.  Persistent.  Relatively stable but pain with walking and weightbearing.  Patient has exam that is consistent with tenderness over the acetabulum and femoral neck.  Patient also has pain with internal and external rotation of the hip.  Patient may continue Tylenol and ibuprofen.  Patient has been given exercises to be doing until she sees her orthopedist at which time she will be further evaluated likely with x-rays and perhaps physical therapy.

## 2020-06-07 NOTE — Patient Instructions (Signed)
Hip Pain The hip is the joint between the upper legs and the lower pelvis. The bones, cartilage, tendons, and muscles of your hip joint support your body and allow you to move around. Hip pain can range from a minor ache to severe pain in one or both of your hips. The pain may be felt on the inside of the hip joint near the groin, or on the outside near the buttocks and upper thigh. You may also have swelling or stiffness in your hip area. Follow these instructions at home: Managing pain, stiffness, and swelling  If directed, put ice on the painful area. To do this: ? Put ice in a plastic bag. ? Place a towel between your skin and the bag. ? Leave the ice on for 20 minutes, 2-3 times a day.  If directed, apply heat to the affected area as often as told by your health care provider. Use the heat source that your health care provider recommends, such as a moist heat pack or a heating pad. ? Place a towel between your skin and the heat source. ? Leave the heat on for 20-30 minutes. ? Remove the heat if your skin turns bright red. This is especially important if you are unable to feel pain, heat, or cold. You may have a greater risk of getting burned.      Activity  Do exercises as told by your health care provider.  Avoid activities that cause pain. General instructions  Take over-the-counter and prescription medicines only as told by your health care provider.  Keep a journal of your symptoms. Write down: ? How often you have hip pain. ? The location of your pain. ? What the pain feels like. ? What makes the pain worse.  Sleep with a pillow between your legs on your most comfortable side.  Keep all follow-up visits as told by your health care provider. This is important.   Contact a health care provider if:  You cannot put weight on your leg.  Your pain or swelling continues or gets worse after one week.  It gets harder to walk.  You have a fever. Get help right away  if:  You fall.  You have a sudden increase in pain and swelling in your hip.  Your hip is red or swollen or very tender to touch. Summary  Hip pain can range from a minor ache to severe pain in one or both of your hips.  The pain may be felt on the inside of the hip joint near the groin, or on the outside near the buttocks and upper thigh.  Avoid activities that cause pain.  Write down how often you have hip pain, the location of the pain, what makes it worse, and what it feels like. This information is not intended to replace advice given to you by your health care provider. Make sure you discuss any questions you have with your health care provider. Document Revised: 09/29/2018 Document Reviewed: 09/29/2018 Elsevier Patient Education  2021 [employer]  

## 2020-06-08 DIAGNOSIS — E1169 Type 2 diabetes mellitus with other specified complication: Secondary | ICD-10-CM | POA: Diagnosis not present

## 2020-06-08 DIAGNOSIS — G8929 Other chronic pain: Secondary | ICD-10-CM | POA: Diagnosis not present

## 2020-06-08 DIAGNOSIS — M545 Low back pain, unspecified: Secondary | ICD-10-CM | POA: Diagnosis not present

## 2020-06-08 DIAGNOSIS — M5442 Lumbago with sciatica, left side: Secondary | ICD-10-CM | POA: Diagnosis not present

## 2020-06-16 DIAGNOSIS — H35033 Hypertensive retinopathy, bilateral: Secondary | ICD-10-CM | POA: Diagnosis not present

## 2020-06-16 DIAGNOSIS — H34831 Tributary (branch) retinal vein occlusion, right eye, with macular edema: Secondary | ICD-10-CM | POA: Diagnosis not present

## 2020-06-17 ENCOUNTER — Other Ambulatory Visit: Payer: Self-pay

## 2020-06-17 ENCOUNTER — Ambulatory Visit (INDEPENDENT_AMBULATORY_CARE_PROVIDER_SITE_OTHER): Payer: Medicare Other | Admitting: Family Medicine

## 2020-06-17 ENCOUNTER — Encounter: Payer: Self-pay | Admitting: Family Medicine

## 2020-06-17 VITALS — BP 122/78 | HR 64 | Temp 97.8°F | Ht 65.0 in | Wt 178.0 lb

## 2020-06-17 DIAGNOSIS — L739 Follicular disorder, unspecified: Secondary | ICD-10-CM

## 2020-06-17 DIAGNOSIS — K4090 Unilateral inguinal hernia, without obstruction or gangrene, not specified as recurrent: Secondary | ICD-10-CM | POA: Diagnosis not present

## 2020-06-17 DIAGNOSIS — N3090 Cystitis, unspecified without hematuria: Secondary | ICD-10-CM | POA: Diagnosis not present

## 2020-06-17 LAB — POCT URINALYSIS DIPSTICK
Bilirubin, UA: NEGATIVE
Glucose, UA: NEGATIVE
Ketones, UA: NEGATIVE
Leukocytes, UA: NEGATIVE
Nitrite, UA: NEGATIVE
Protein, UA: NEGATIVE
Spec Grav, UA: 1.01 (ref 1.010–1.025)
Urobilinogen, UA: 0.2 E.U./dL
pH, UA: 6 (ref 5.0–8.0)

## 2020-06-17 MED ORDER — DOXYCYCLINE HYCLATE 100 MG PO TABS: 100.0000 mg | ORAL_TABLET | Freq: Two times a day (BID) | ORAL | 0 refills | Status: DC

## 2020-06-17 MED ORDER — MUPIROCIN 2 % EX OINT: 1.0000 "application " | TOPICAL_OINTMENT | Freq: Two times a day (BID) | CUTANEOUS | 0 refills | Status: AC

## 2020-06-17 NOTE — Progress Notes (Signed)
Date:  06/17/2020   Name:  Traci Bowen   DOB:  02-21-1941   MRN:  [id number]   Chief Complaint: Cystitis (Burning when urinates), GI Problem (X3 weeks), and left groin area  (Redness in groin, painful )  GI Problem Primary symptoms do not include fever, weight loss, fatigue, abdominal pain, nausea, vomiting, diarrhea, melena, hematemesis, jaundice, hematochezia, dysuria, myalgias, arthralgias or rash. The illness began more than 7 days ago (3 weeks). The onset was gradual. The problem has not changed since onset. The illness does not include chills, constipation or back pain. Associated medical issues do not include inflammatory bowel disease or GERD.    Lab Results  Component Value Date   CREATININE 0.70 11/05/2019   BUN 10 10/01/2019   NA 145 (H) 10/01/2019   K 4.0 10/01/2019   CL 101 10/01/2019   CO2 29 10/01/2019   Lab Results  Component Value Date   CHOL 175 10/01/2019   HDL 40 10/01/2019   LDLCALC 101 (H) 10/01/2019   TRIG 194 (H) 10/01/2019   CHOLHDL 3.5 01/13/2018   No results found for: TSH No results found for: HGBA1C Lab Results  Component Value Date   WBC 8.8 06/14/2017   HGB 15.6 06/14/2017   HCT 45.6 06/14/2017   MCV 86.6 06/14/2017   PLT 279 06/14/2017   Lab Results  Component Value Date   ALT 24 10/01/2019   AST 27 10/01/2019   ALKPHOS 64 10/01/2019   BILITOT 0.4 10/01/2019     Review of Systems  Constitutional: Negative.  Negative for chills, fatigue, fever, unexpected weight change and weight loss.  HENT: Negative for congestion, ear discharge, ear pain, rhinorrhea, sinus pressure, sneezing and sore throat.   Eyes: Negative for double vision, photophobia, pain, discharge, redness and itching.  Respiratory: Negative for cough, shortness of breath, wheezing and stridor.   Gastrointestinal: Negative for abdominal pain, blood in stool, constipation, diarrhea, hematemesis, hematochezia, jaundice, melena, nausea and vomiting.  Endocrine:  Negative for cold intolerance, heat intolerance, polydipsia, polyphagia and polyuria.  Genitourinary: Negative for dysuria, flank pain, frequency, hematuria, menstrual problem, pelvic pain, urgency, vaginal bleeding and vaginal discharge.  Musculoskeletal: Negative for arthralgias, back pain and myalgias.  Skin: Negative for rash.  Allergic/Immunologic: Negative for environmental allergies and food allergies.  Neurological: Negative for dizziness, weakness, light-headedness, numbness and headaches.  Hematological: Negative for adenopathy. Does not bruise/bleed easily.  Psychiatric/Behavioral: Negative for dysphoric mood. The patient is not nervous/anxious.     Patient Active Problem List   Diagnosis Date Noted   Sebaceous cyst 07/25/2018   Neuropathy 01/06/2016   Essential hypertension 06/01/2015   Hyperlipidemia 06/01/2015    Allergies  Allergen Reactions   Betadine [Povidone Iodine]    Penicillins     Past Surgical History:  Procedure Laterality Date   CATARACT EXTRACTION Bilateral    EYE SURGERY Left march 2016   cysts removed    LITHOTRIPSY     VAGINAL HYSTERECTOMY      Social History   Tobacco Use   Smoking status: Former Smoker    Packs/day: 0.50    Years: 3.00    Pack years: 1.50    Types: Cigarettes    Quit date: 1989    Years since quitting: 33.0   Smokeless tobacco: Never Used   Tobacco comment: smoking cessation materials not required  Vaping Use   Vaping Use: Never used  Substance Use Topics   Alcohol use: No    Alcohol/week: 0.0 standard drinks  Drug use: No     Medication list has been reviewed and updated.  Current Meds  Medication Sig   amLODipine (NORVASC) 5 MG tablet 1 tablet daily   aspirin 81 MG EC tablet Take 1 tablet by mouth daily.   calcium carbonate (OSCAL) 1500 (600 Ca) MG TABS tablet Take 1 tablet by mouth daily.   Cholecalciferol (VITAMIN D) 2000 units tablet Take by mouth.   gabapentin (NEURONTIN) 300 MG  capsule TAKE (1) CAPSULE BY MOUTH AT BEDTIME   losartan (COZAAR) 25 MG tablet Take 1 tablet (25 mg total) by mouth daily.   meloxicam (MOBIC) 7.5 MG tablet Take by mouth.   Multiple Vitamins-Minerals (CENTRUM SILVER PO) Take 1 capsule by mouth daily at 6 (six) AM.   nitroGLYCERIN (NITROSTAT) 0.4 MG SL tablet Place one tablet under the tongue every 5 minutes as needed for chest pain. May take up to 3 doses.   Omega-3 Fatty Acids (FISH OIL) 1000 MG CAPS Take 1 capsule (1,000 mg total) by mouth 2 (two) times daily.   simvastatin (ZOCOR) 40 MG tablet Take 1 tablet (40 mg total) by mouth at bedtime.   tiZANidine (ZANAFLEX) 2 MG tablet Take by mouth.   triamcinolone cream (KENALOG) 0.1 % Apply 1 application topically 2 (two) times daily.   vitamin B-12 (CYANOCOBALAMIN) 1000 MCG tablet Take 1,000 mcg by mouth daily.   Zinc 100 MG TABS Take 1 tablet by mouth daily.    PHQ 2/9 Scores 06/17/2020 04/05/2020 01/13/2020 10/01/2019  PHQ - 2 Score 0 0 0 0  PHQ- 9 Score 0 0 - 0    GAD 7 : Generalized Anxiety Score 06/17/2020 04/05/2020 10/01/2019 09/01/2019  Nervous, Anxious, on Edge 0 0 0 0  Control/stop worrying 0 0 0 0  Worry too much - different things 0 0 0 0  Trouble relaxing 0 0 0 0  Restless 0 0 0 0  Easily annoyed or irritable 0 0 0 0  Afraid - awful might happen 0 0 0 0  Total GAD 7 Score 0 0 0 0    BP Readings from Last 3 Encounters:  06/17/20 122/78  06/07/20 130/80  04/05/20 (!) 140/100    Physical Exam Vitals and nursing note reviewed.  Abdominal:     General: Bowel sounds are normal.     Palpations: There is no shifting dullness, fluid wave, hepatomegaly, splenomegaly, mass or pulsatile mass.     Tenderness: There is abdominal tenderness. There is no right CVA tenderness or left CVA tenderness.       Wt Readings from Last 3 Encounters:  06/17/20 178 lb (80.7 kg)  06/07/20 179 lb (81.2 kg)  04/05/20 180 lb (81.6 kg)    BP 122/78    Pulse 64    Temp 97.8 F (36.6 C)  (Oral)    Ht 5\' 5"  (1.651 m)    Wt 178 lb (80.7 kg)    BMI 29.62 kg/m   Assessment and Plan: 1. Cystitis Patient with history of cystitis needs recheck of urine given her symptoms.  Urinalysis is negative for leukocytes and only trace of blood is noted at this time this is inconsistent with a cystitis. - POCT Urinalysis Dipstick  2. Unilateral inguinal hernia without obstruction or gangrene, recurrence not specified Onset several weeks ago of pain in this area initially thought to be may be secondary to the hip.  There is tenderness with palpation with no palpable mass.  There is no abdominal pain in any of the  quadrants but this may be a inguinal hernia as it is above the inguinal crease.  I do not think this is a femoral hernia but we will refer to general surgery for evaluation. - Ambulatory referral to General Surgery  3. Folliculitis New onset.  Episodic.  There is a redness with a follicle that has some erythema but no purulence noted there is likely is an early folliculitis for which we will treat with doxycycline 100 mg twice a day in the event that she is allergic to penicillin and we will avoid cephalosporins at this time and topically she will apply Bactroban twice a day. - doxycycline (VIBRA-TABS) 100 MG tablet; Take 1 tablet (100 mg total) by mouth 2 (two) times daily.  Dispense: 20 tablet; Refill: 0

## 2020-06-28 DIAGNOSIS — M5442 Lumbago with sciatica, left side: Secondary | ICD-10-CM | POA: Diagnosis not present

## 2020-06-28 DIAGNOSIS — G8929 Other chronic pain: Secondary | ICD-10-CM | POA: Diagnosis not present

## 2020-07-06 ENCOUNTER — Ambulatory Visit: Payer: Medicare Other | Admitting: Surgery

## 2020-07-11 ENCOUNTER — Other Ambulatory Visit: Payer: Self-pay

## 2020-07-11 ENCOUNTER — Encounter: Payer: Self-pay | Admitting: Surgery

## 2020-07-11 ENCOUNTER — Ambulatory Visit (INDEPENDENT_AMBULATORY_CARE_PROVIDER_SITE_OTHER): Payer: Medicare Other | Admitting: Surgery

## 2020-07-11 VITALS — BP 173/83 | HR 85 | Temp 98.0°F | Ht 65.0 in | Wt 176.0 lb

## 2020-07-11 DIAGNOSIS — L989 Disorder of the skin and subcutaneous tissue, unspecified: Secondary | ICD-10-CM | POA: Diagnosis not present

## 2020-07-11 DIAGNOSIS — L729 Follicular cyst of the skin and subcutaneous tissue, unspecified: Secondary | ICD-10-CM

## 2020-07-11 DIAGNOSIS — R1032 Left lower quadrant pain: Secondary | ICD-10-CM

## 2020-07-11 DIAGNOSIS — N3090 Cystitis, unspecified without hematuria: Secondary | ICD-10-CM

## 2020-07-11 MED ORDER — SULFAMETHOXAZOLE-TRIMETHOPRIM 800-160 MG PO TABS: 1.0000 | ORAL_TABLET | Freq: Two times a day (BID) | ORAL | 0 refills | Status: AC

## 2020-07-11 NOTE — Progress Notes (Unsigned)
Placed ref to uro

## 2020-07-11 NOTE — Progress Notes (Signed)
07/11/2020  Reason for Visit:  Left groin pain  Referring Provider:  Otilio Miu, MD  History of Present Illness: Traci Bowen is a 80 y.o. female presenting for evaluation of left groin pain, possible left inguinal hernia.  The patient reports she's had discomfort in the left groin for about 1-2 months.  She was told that she possibly could have hip/back issues and also possibly an ingrown hair follicle that could have gotten infected.  She was given a trial of Doxycycline, but the patient reports that she never took it because many years ago it made her very sick.  She reports that the area in the left groin also was red as well as tender.  The redness has resolved, but there's still discomfort in the area.  Denies any drainage, fevers, chills, constipation, diarrhea.  She reports she had one episode of nausea/vomiting over the weekend, but she thinks it was related to something she ate.  Of note, she had a CT scan in 10/2019.  I have personally viewed the images.  There was no true evidence of a left inguinal hernia at the time.  Past Medical History: Past Medical History:  Diagnosis Date  . Hyperlipidemia   . Hypertension   . Neuropathy   . Renal disorder   . Spinal stenosis      Past Surgical History: Past Surgical History:  Procedure Laterality Date  . CATARACT EXTRACTION Bilateral   . EYE SURGERY Left march 2016   cysts removed   . LITHOTRIPSY    . VAGINAL HYSTERECTOMY      Home Medications: Prior to Admission medications   Medication Sig Start Date End Date Taking? Authorizing Provider  amLODipine (NORVASC) 5 MG tablet 1 tablet daily 04/05/20  Yes Juline Patch, MD  aspirin 81 MG EC tablet Take 1 tablet by mouth daily. 09/22/19 09/21/20 Yes [provider]  calcium carbonate (OSCAL) 1500 (600 Ca) MG TABS tablet Take 1 tablet by mouth daily.   Yes [provider]  Cholecalciferol (VITAMIN D) 2000 units tablet Take by mouth.   Yes [provider]  gabapentin (NEURONTIN) 300 MG capsule TAKE (1) CAPSULE BY MOUTH AT BEDTIME 02/24/20  Yes Juline Patch, MD  losartan (COZAAR) 25 MG tablet Take 1 tablet (25 mg total) by mouth daily. 04/05/20  Yes Juline Patch, MD  Multiple Vitamins-Minerals (CENTRUM SILVER PO) Take 1 capsule by mouth daily at 6 (six) AM.   Yes [provider]  mupirocin ointment (BACTROBAN) 2 % Apply 1 application topically 2 (two) times daily. 06/17/20  Yes Juline Patch, MD  Omega-3 Fatty Acids (FISH OIL) 1000 MG CAPS Take 1 capsule (1,000 mg total) by mouth 2 (two) times daily. 07/16/18  Yes Juline Patch, MD  simvastatin (ZOCOR) 40 MG tablet Take 1 tablet (40 mg total) by mouth at bedtime. 04/05/20  Yes Juline Patch, MD  sulfamethoxazole-trimethoprim (BACTRIM DS) 800-160 MG tablet Take 1 tablet by mouth 2 (two) times daily for 7 days. 07/11/20 07/18/20 Yes Billyjack Trompeter, Jacqulyn Bath, MD  tiZANidine (ZANAFLEX) 2 MG tablet Take by mouth. 06/08/20  Yes [provider]  triamcinolone cream (KENALOG) 0.1 % Apply 1 application topically 2 (two) times daily. 10/01/19  Yes Juline Patch, MD  vitamin B-12 (CYANOCOBALAMIN) 1000 MCG tablet Take 1,000 mcg by mouth daily.   Yes [provider]  Zinc 100 MG TABS Take 1 tablet by mouth daily.   Yes [provider]    Allergies: Allergies  Allergen Reactions  . Betadine [Povidone Iodine]   . Doxycycline Nausea And Vomiting  . Penicillins     Social History:  reports that she quit smoking about 33 years ago. Her smoking use included cigarettes. She has a 1.50 pack-year smoking history. She has never used smokeless tobacco. She reports that she does not drink alcohol and does not use drugs.   Family History: Family History  Problem Relation Age of Onset  . Diabetes Mother   . Hypertension Mother   . Throat cancer Father   . Colon cancer Brother   . Stroke Sister   . Heart attack Brother     Review of Systems: Review of Systems   Constitutional: Negative for chills and fever.  Respiratory: Negative for shortness of breath.   Cardiovascular: Negative for chest pain.  Gastrointestinal: Positive for abdominal pain, nausea and vomiting. Negative for constipation and diarrhea.  Skin:       Redness/tender left groin    Physical Exam BP (!) 173/83   Pulse 85   Temp 98 F (36.7 C)   Ht 5\' 5"  (1.651 m)   Wt 176 lb (79.8 kg)   SpO2 96%   BMI 29.29 kg/m  CONSTITUTIONAL: No acute distress, well nourished. HEENT:  Normocephalic, atraumatic, extraocular motion intact. RESPIRATORY:  Normal respiratory effort without pathologic use of accessory muscles. CARDIOVASCULAR:  Regular rhythm and rate. GI: The abdomen is soft, non-distended, with some mild discomfort in the left groin, very localized to two small areas lateral and medial.  On palpation, there is no noticeable hernia defect and no changes with coughing, but there are small palpable nodes/masses in the dermal layers of the skin vs very superficial subcutaneous layer.  No evidence of hernia on the right groin.  NEUROLOGIC:  Motor and sensation is grossly normal.  Cranial nerves are grossly intact. PSYCH:  Alert and oriented to person, place and time. Affect is normal.  Laboratory Analysis: No results found for this or any previous visit (from the past 24 hour(s)).  Imaging: CT scan c/a/p 11/05/19: IMPRESSION: 1. No acute process. No evidence of aortic aneurysm or explanation for chest pain. 2. Cholelithiasis. 3. Aortic Atherosclerosis (ICD10-I70.0) and Emphysema (ICD10-J43.9). 4.  Coronary artery atherosclerosis. 5. Hepatomegaly.   Assessment and Plan: This is a 80 y.o. female with left groin pain.  --From the patient's description, this sounds like it could have been an infected sebaceous cyst vs ingrown hair that is still somewhat tender.  The patient reports the area was red before and that has subsided.  On exam, I do not feel any bulging or hernia  defect when coughing, but I do feel small bumps under the skin which could be cysts.   --As a precaution, will give her a 1 week course of Bactrim (had adverse reaction with Doxycycline and has allergy to PCN), and will have her call the office next week if the pain has not resolved or improved.  If that's the case, then we can order an ultrasound of the left groin to further evaluate for mass vs hernia.   Face-to-face time spent with the patient and care providers was 40 minutes, with more than 50% of the time spent counseling, educating, and coordinating care of the patient.     Melvyn Neth, [facility]

## 2020-07-11 NOTE — Patient Instructions (Addendum)
We have prescribed you some Bactrim antibiotic. You will take this twice a day for 7 day.   Call us next Wednesday to give [place] an update on the area to see if it has improved. It it still is not any better we will order an ultrasound of the left groin area.   Please call and ask to speak with a nurse if you develop questions or concerns.

## 2020-07-13 ENCOUNTER — Ambulatory Visit: Payer: Medicare Other | Admitting: Surgery

## 2020-07-20 ENCOUNTER — Telehealth: Payer: Self-pay | Admitting: *Deleted

## 2020-07-20 NOTE — Telephone Encounter (Signed)
Patient called and saw Dr Hampton Abbot on 07/11/20 she is taking Bactrim and is suppose to call back in a week. She stated that the pain is going away but this morning she had 2 bowel movements with blood in the stool and on the toilet paper. Please call and adivse

## 2020-07-20 NOTE — Telephone Encounter (Signed)
Patient stated she had a hard stool bowel movement this morning and noticed a small amount of blood on the tissue when she wiped. When asked if she had hemorrhoids she didn't know- she said she had not had a bleeding before the bowel movement. She stated it has not happened anymore today- I let her know if it happened again to call the office and we can check her. Denies pain. She feels fine.

## 2020-07-21 DIAGNOSIS — H35033 Hypertensive retinopathy, bilateral: Secondary | ICD-10-CM | POA: Diagnosis not present

## 2020-07-21 DIAGNOSIS — H34831 Tributary (branch) retinal vein occlusion, right eye, with macular edema: Secondary | ICD-10-CM | POA: Diagnosis not present

## 2020-08-03 DIAGNOSIS — M6281 Muscle weakness (generalized): Secondary | ICD-10-CM | POA: Diagnosis not present

## 2020-08-03 DIAGNOSIS — M2569 Stiffness of other specified joint, not elsewhere classified: Secondary | ICD-10-CM | POA: Diagnosis not present

## 2020-08-03 DIAGNOSIS — M5442 Lumbago with sciatica, left side: Secondary | ICD-10-CM | POA: Diagnosis not present

## 2020-08-03 DIAGNOSIS — M25552 Pain in left hip: Secondary | ICD-10-CM | POA: Diagnosis not present

## 2020-08-03 DIAGNOSIS — G8929 Other chronic pain: Secondary | ICD-10-CM | POA: Diagnosis not present

## 2020-08-16 DIAGNOSIS — M6281 Muscle weakness (generalized): Secondary | ICD-10-CM | POA: Diagnosis not present

## 2020-08-16 DIAGNOSIS — M5442 Lumbago with sciatica, left side: Secondary | ICD-10-CM | POA: Diagnosis not present

## 2020-08-16 DIAGNOSIS — M25552 Pain in left hip: Secondary | ICD-10-CM | POA: Diagnosis not present

## 2020-08-16 DIAGNOSIS — G8929 Other chronic pain: Secondary | ICD-10-CM | POA: Diagnosis not present

## 2020-08-16 DIAGNOSIS — M2569 Stiffness of other specified joint, not elsewhere classified: Secondary | ICD-10-CM | POA: Diagnosis not present

## 2020-08-30 DIAGNOSIS — M2569 Stiffness of other specified joint, not elsewhere classified: Secondary | ICD-10-CM | POA: Diagnosis not present

## 2020-08-30 DIAGNOSIS — M6281 Muscle weakness (generalized): Secondary | ICD-10-CM | POA: Diagnosis not present

## 2020-08-30 DIAGNOSIS — M25552 Pain in left hip: Secondary | ICD-10-CM | POA: Diagnosis not present

## 2020-08-30 DIAGNOSIS — M5442 Lumbago with sciatica, left side: Secondary | ICD-10-CM | POA: Diagnosis not present

## 2020-08-30 DIAGNOSIS — G8929 Other chronic pain: Secondary | ICD-10-CM | POA: Diagnosis not present

## 2020-08-31 DIAGNOSIS — J439 Emphysema, unspecified: Secondary | ICD-10-CM | POA: Diagnosis not present

## 2020-08-31 DIAGNOSIS — H40003 Preglaucoma, unspecified, bilateral: Secondary | ICD-10-CM | POA: Diagnosis not present

## 2020-08-31 DIAGNOSIS — G603 Idiopathic progressive neuropathy: Secondary | ICD-10-CM | POA: Diagnosis not present

## 2020-08-31 DIAGNOSIS — M722 Plantar fascial fibromatosis: Secondary | ICD-10-CM | POA: Diagnosis not present

## 2020-08-31 DIAGNOSIS — I10 Essential (primary) hypertension: Secondary | ICD-10-CM | POA: Diagnosis not present

## 2020-08-31 DIAGNOSIS — E119 Type 2 diabetes mellitus without complications: Secondary | ICD-10-CM | POA: Diagnosis not present

## 2020-08-31 DIAGNOSIS — I700 Atherosclerosis of aorta: Secondary | ICD-10-CM | POA: Diagnosis not present

## 2020-08-31 DIAGNOSIS — L723 Sebaceous cyst: Secondary | ICD-10-CM | POA: Diagnosis not present

## 2020-08-31 DIAGNOSIS — E785 Hyperlipidemia, unspecified: Secondary | ICD-10-CM | POA: Diagnosis not present

## 2020-08-31 DIAGNOSIS — H029 Unspecified disorder of eyelid: Secondary | ICD-10-CM | POA: Diagnosis not present

## 2020-08-31 DIAGNOSIS — N200 Calculus of kidney: Secondary | ICD-10-CM | POA: Diagnosis not present

## 2020-08-31 DIAGNOSIS — M810 Age-related osteoporosis without current pathological fracture: Secondary | ICD-10-CM | POA: Diagnosis not present

## 2020-09-01 DIAGNOSIS — H34831 Tributary (branch) retinal vein occlusion, right eye, with macular edema: Secondary | ICD-10-CM | POA: Diagnosis not present

## 2020-09-01 DIAGNOSIS — H35033 Hypertensive retinopathy, bilateral: Secondary | ICD-10-CM | POA: Diagnosis not present

## 2020-09-05 DIAGNOSIS — M6281 Muscle weakness (generalized): Secondary | ICD-10-CM | POA: Diagnosis not present

## 2020-09-05 DIAGNOSIS — G8929 Other chronic pain: Secondary | ICD-10-CM | POA: Diagnosis not present

## 2020-09-05 DIAGNOSIS — G603 Idiopathic progressive neuropathy: Secondary | ICD-10-CM | POA: Diagnosis not present

## 2020-09-05 DIAGNOSIS — M722 Plantar fascial fibromatosis: Secondary | ICD-10-CM | POA: Diagnosis not present

## 2020-09-05 DIAGNOSIS — I10 Essential (primary) hypertension: Secondary | ICD-10-CM | POA: Diagnosis not present

## 2020-09-05 DIAGNOSIS — J439 Emphysema, unspecified: Secondary | ICD-10-CM | POA: Diagnosis not present

## 2020-09-05 DIAGNOSIS — R6889 Other general symptoms and signs: Secondary | ICD-10-CM | POA: Diagnosis not present

## 2020-09-05 DIAGNOSIS — M818 Other osteoporosis without current pathological fracture: Secondary | ICD-10-CM | POA: Diagnosis not present

## 2020-09-05 DIAGNOSIS — E785 Hyperlipidemia, unspecified: Secondary | ICD-10-CM | POA: Diagnosis not present

## 2020-09-05 DIAGNOSIS — G629 Polyneuropathy, unspecified: Secondary | ICD-10-CM | POA: Diagnosis not present

## 2020-09-05 DIAGNOSIS — N200 Calculus of kidney: Secondary | ICD-10-CM | POA: Diagnosis not present

## 2020-09-05 DIAGNOSIS — I700 Atherosclerosis of aorta: Secondary | ICD-10-CM | POA: Diagnosis not present

## 2020-09-05 DIAGNOSIS — R29898 Other symptoms and signs involving the musculoskeletal system: Secondary | ICD-10-CM | POA: Diagnosis not present

## 2020-09-05 DIAGNOSIS — H029 Unspecified disorder of eyelid: Secondary | ICD-10-CM | POA: Diagnosis not present

## 2020-09-05 DIAGNOSIS — E119 Type 2 diabetes mellitus without complications: Secondary | ICD-10-CM | POA: Diagnosis not present

## 2020-09-05 DIAGNOSIS — M810 Age-related osteoporosis without current pathological fracture: Secondary | ICD-10-CM | POA: Diagnosis not present

## 2020-09-05 DIAGNOSIS — H40003 Preglaucoma, unspecified, bilateral: Secondary | ICD-10-CM | POA: Diagnosis not present

## 2020-09-05 DIAGNOSIS — Z1329 Encounter for screening for other suspected endocrine disorder: Secondary | ICD-10-CM | POA: Diagnosis not present

## 2020-09-05 DIAGNOSIS — M25552 Pain in left hip: Secondary | ICD-10-CM | POA: Diagnosis not present

## 2020-09-05 DIAGNOSIS — L723 Sebaceous cyst: Secondary | ICD-10-CM | POA: Diagnosis not present

## 2020-09-05 DIAGNOSIS — M5442 Lumbago with sciatica, left side: Secondary | ICD-10-CM | POA: Diagnosis not present

## 2020-09-05 DIAGNOSIS — M2569 Stiffness of other specified joint, not elsewhere classified: Secondary | ICD-10-CM | POA: Diagnosis not present

## 2020-09-26 ENCOUNTER — Ambulatory Visit: Payer: Medicare Other | Admitting: Family Medicine

## 2020-10-07 DIAGNOSIS — Z03818 Encounter for observation for suspected exposure to other biological agents ruled out: Secondary | ICD-10-CM | POA: Diagnosis not present

## 2020-10-07 DIAGNOSIS — R059 Cough, unspecified: Secondary | ICD-10-CM | POA: Diagnosis not present

## 2020-10-07 DIAGNOSIS — R0981 Nasal congestion: Secondary | ICD-10-CM | POA: Diagnosis not present

## 2020-10-07 DIAGNOSIS — J0110 Acute frontal sinusitis, unspecified: Secondary | ICD-10-CM | POA: Diagnosis not present

## 2020-10-07 DIAGNOSIS — J40 Bronchitis, not specified as acute or chronic: Secondary | ICD-10-CM | POA: Diagnosis not present

## 2020-10-14 ENCOUNTER — Other Ambulatory Visit: Payer: Self-pay | Admitting: Orthopedic Surgery

## 2020-10-14 DIAGNOSIS — E1169 Type 2 diabetes mellitus with other specified complication: Secondary | ICD-10-CM | POA: Diagnosis not present

## 2020-10-14 DIAGNOSIS — G8929 Other chronic pain: Secondary | ICD-10-CM | POA: Diagnosis not present

## 2020-10-14 DIAGNOSIS — M4807 Spinal stenosis, lumbosacral region: Secondary | ICD-10-CM

## 2020-10-14 DIAGNOSIS — M5441 Lumbago with sciatica, right side: Secondary | ICD-10-CM

## 2020-10-19 DIAGNOSIS — M810 Age-related osteoporosis without current pathological fracture: Secondary | ICD-10-CM | POA: Diagnosis not present

## 2020-10-27 ENCOUNTER — Other Ambulatory Visit: Payer: Self-pay | Admitting: Family Medicine

## 2020-10-27 DIAGNOSIS — E782 Mixed hyperlipidemia: Secondary | ICD-10-CM

## 2020-10-27 DIAGNOSIS — I10 Essential (primary) hypertension: Secondary | ICD-10-CM

## 2020-10-27 NOTE — Telephone Encounter (Signed)
No future visit at this time  

## 2020-10-28 ENCOUNTER — Other Ambulatory Visit: Payer: Self-pay

## 2020-10-28 ENCOUNTER — Ambulatory Visit
Admission: RE | Admit: 2020-10-28 | Discharge: 2020-10-28 | Disposition: A | Discharge status: Home / Self Care | Payer: Medicare Other | Source: Ambulatory Visit | Attending: Orthopedic Surgery | Admitting: Orthopedic Surgery

## 2020-10-28 DIAGNOSIS — M4807 Spinal stenosis, lumbosacral region: Secondary | ICD-10-CM | POA: Diagnosis not present

## 2020-10-28 DIAGNOSIS — G8929 Other chronic pain: Secondary | ICD-10-CM | POA: Diagnosis not present

## 2020-10-28 DIAGNOSIS — M5441 Lumbago with sciatica, right side: Secondary | ICD-10-CM | POA: Diagnosis not present

## 2020-10-28 DIAGNOSIS — M545 Low back pain, unspecified: Secondary | ICD-10-CM | POA: Diagnosis not present

## 2020-10-31 DIAGNOSIS — J449 Chronic obstructive pulmonary disease, unspecified: Secondary | ICD-10-CM | POA: Diagnosis not present

## 2020-10-31 DIAGNOSIS — E119 Type 2 diabetes mellitus without complications: Secondary | ICD-10-CM | POA: Diagnosis not present

## 2020-10-31 DIAGNOSIS — I7090 Unspecified atherosclerosis: Secondary | ICD-10-CM | POA: Diagnosis not present

## 2020-10-31 DIAGNOSIS — I712 Thoracic aortic aneurysm, without rupture: Secondary | ICD-10-CM | POA: Diagnosis not present

## 2020-10-31 DIAGNOSIS — R011 Cardiac murmur, unspecified: Secondary | ICD-10-CM | POA: Diagnosis not present

## 2020-10-31 DIAGNOSIS — I6523 Occlusion and stenosis of bilateral carotid arteries: Secondary | ICD-10-CM | POA: Diagnosis not present

## 2020-10-31 DIAGNOSIS — R0989 Other specified symptoms and signs involving the circulatory and respiratory systems: Secondary | ICD-10-CM | POA: Diagnosis not present

## 2020-10-31 DIAGNOSIS — I10 Essential (primary) hypertension: Secondary | ICD-10-CM | POA: Diagnosis not present

## 2020-10-31 DIAGNOSIS — E782 Mixed hyperlipidemia: Secondary | ICD-10-CM | POA: Diagnosis not present

## 2020-10-31 DIAGNOSIS — R531 Weakness: Secondary | ICD-10-CM | POA: Diagnosis not present

## 2020-10-31 DIAGNOSIS — I208 Other forms of angina pectoris: Secondary | ICD-10-CM | POA: Diagnosis not present

## 2020-11-03 DIAGNOSIS — H34831 Tributary (branch) retinal vein occlusion, right eye, with macular edema: Secondary | ICD-10-CM | POA: Diagnosis not present

## 2020-11-28 NOTE — Addendum Note (Signed)
Encounter addended by: Annie Paras on: 11/28/2020 11:43 AM  Actions taken: Letter saved

## 2020-12-12 ENCOUNTER — Other Ambulatory Visit (HOSPITAL_COMMUNITY): Payer: Self-pay | Admitting: Gerontology

## 2020-12-12 ENCOUNTER — Other Ambulatory Visit: Payer: Self-pay | Admitting: Gerontology

## 2020-12-12 DIAGNOSIS — M545 Low back pain, unspecified: Secondary | ICD-10-CM

## 2020-12-15 DIAGNOSIS — H34831 Tributary (branch) retinal vein occlusion, right eye, with macular edema: Secondary | ICD-10-CM | POA: Diagnosis not present

## 2020-12-21 ENCOUNTER — Ambulatory Visit
Admission: RE | Admit: 2020-12-21 | Discharge: 2020-12-21 | Disposition: A | Discharge status: Home / Self Care | Payer: Medicare Other | Source: Ambulatory Visit | Attending: Gerontology | Admitting: Gerontology

## 2020-12-21 ENCOUNTER — Other Ambulatory Visit: Payer: Self-pay

## 2020-12-21 DIAGNOSIS — M545 Low back pain, unspecified: Secondary | ICD-10-CM | POA: Diagnosis not present

## 2020-12-21 DIAGNOSIS — M160 Bilateral primary osteoarthritis of hip: Secondary | ICD-10-CM | POA: Diagnosis not present

## 2020-12-21 DIAGNOSIS — Z90710 Acquired absence of both cervix and uterus: Secondary | ICD-10-CM | POA: Diagnosis not present

## 2020-12-21 DIAGNOSIS — S32511A Fracture of superior rim of right pubis, initial encounter for closed fracture: Secondary | ICD-10-CM | POA: Diagnosis not present

## 2020-12-21 DIAGNOSIS — S32110A Nondisplaced Zone I fracture of sacrum, initial encounter for closed fracture: Secondary | ICD-10-CM | POA: Diagnosis not present

## 2021-01-03 DIAGNOSIS — E1169 Type 2 diabetes mellitus with other specified complication: Secondary | ICD-10-CM | POA: Diagnosis not present

## 2021-01-03 DIAGNOSIS — S32591A Other specified fracture of right pubis, initial encounter for closed fracture: Secondary | ICD-10-CM | POA: Diagnosis not present

## 2021-01-03 DIAGNOSIS — R102 Pelvic and perineal pain: Secondary | ICD-10-CM | POA: Diagnosis not present

## 2021-01-03 DIAGNOSIS — S32110A Nondisplaced Zone I fracture of sacrum, initial encounter for closed fracture: Secondary | ICD-10-CM | POA: Diagnosis not present

## 2021-01-16 ENCOUNTER — Ambulatory Visit: Payer: Medicare Other

## 2021-01-26 DIAGNOSIS — H34831 Tributary (branch) retinal vein occlusion, right eye, with macular edema: Secondary | ICD-10-CM | POA: Diagnosis not present

## 2021-03-03 DIAGNOSIS — Z23 Encounter for immunization: Secondary | ICD-10-CM | POA: Diagnosis not present

## 2021-03-27 ENCOUNTER — Other Ambulatory Visit: Payer: Self-pay | Admitting: Family Medicine

## 2021-03-27 DIAGNOSIS — I10 Essential (primary) hypertension: Secondary | ICD-10-CM

## 2021-03-27 NOTE — Telephone Encounter (Signed)
Requested medications are due for refill today.  yes  Requested medications are on the active medications list.  yes  Last refill. 04/05/2020  Future visit scheduled.   no  Notes to clinic.  PCP listed as Toni Arthurs.

## 2021-04-04 DIAGNOSIS — E785 Hyperlipidemia, unspecified: Secondary | ICD-10-CM | POA: Diagnosis not present

## 2021-04-04 DIAGNOSIS — R053 Chronic cough: Secondary | ICD-10-CM | POA: Diagnosis not present

## 2021-04-04 DIAGNOSIS — I700 Atherosclerosis of aorta: Secondary | ICD-10-CM | POA: Diagnosis not present

## 2021-04-04 DIAGNOSIS — M810 Age-related osteoporosis without current pathological fracture: Secondary | ICD-10-CM | POA: Diagnosis not present

## 2021-04-04 DIAGNOSIS — R059 Cough, unspecified: Secondary | ICD-10-CM | POA: Diagnosis not present

## 2021-04-04 DIAGNOSIS — E1140 Type 2 diabetes mellitus with diabetic neuropathy, unspecified: Secondary | ICD-10-CM | POA: Diagnosis not present

## 2021-04-04 DIAGNOSIS — R0982 Postnasal drip: Secondary | ICD-10-CM | POA: Diagnosis not present

## 2021-04-04 DIAGNOSIS — I119 Hypertensive heart disease without heart failure: Secondary | ICD-10-CM | POA: Diagnosis not present

## 2021-04-04 DIAGNOSIS — J439 Emphysema, unspecified: Secondary | ICD-10-CM | POA: Diagnosis not present

## 2021-04-04 DIAGNOSIS — E119 Type 2 diabetes mellitus without complications: Secondary | ICD-10-CM | POA: Diagnosis not present

## 2021-04-04 DIAGNOSIS — Z0000 Encounter for general adult medical examination without abnormal findings: Secondary | ICD-10-CM | POA: Diagnosis not present

## 2021-04-06 DIAGNOSIS — H34831 Tributary (branch) retinal vein occlusion, right eye, with macular edema: Secondary | ICD-10-CM | POA: Diagnosis not present

## 2021-04-06 DIAGNOSIS — H35033 Hypertensive retinopathy, bilateral: Secondary | ICD-10-CM | POA: Diagnosis not present

## 2021-08-22 ENCOUNTER — Other Ambulatory Visit: Payer: Self-pay | Admitting: Internal Medicine

## 2021-08-22 DIAGNOSIS — R600 Localized edema: Secondary | ICD-10-CM

## 2021-08-22 DIAGNOSIS — R0989 Other specified symptoms and signs involving the circulatory and respiratory systems: Secondary | ICD-10-CM

## 2021-08-30 ENCOUNTER — Ambulatory Visit
Admission: RE | Admit: 2021-08-30 | Discharge: 2021-08-30 | Disposition: A | Discharge status: Home / Self Care | Payer: Medicare Other | Source: Ambulatory Visit | Attending: Internal Medicine | Admitting: Internal Medicine

## 2021-08-30 DIAGNOSIS — R0989 Other specified symptoms and signs involving the circulatory and respiratory systems: Secondary | ICD-10-CM | POA: Insufficient documentation

## 2021-08-30 DIAGNOSIS — R600 Localized edema: Secondary | ICD-10-CM | POA: Diagnosis not present

## 2021-12-21 ENCOUNTER — Other Ambulatory Visit: Payer: Self-pay | Admitting: Internal Medicine

## 2021-12-21 DIAGNOSIS — I208 Other forms of angina pectoris: Secondary | ICD-10-CM

## 2021-12-22 ENCOUNTER — Telehealth (HOSPITAL_COMMUNITY): Payer: Self-pay | Admitting: *Deleted

## 2021-12-22 MED ORDER — METOPROLOL TARTRATE 25 MG PO TABS: ORAL_TABLET | ORAL | 0 refills | Status: AC

## 2021-12-22 NOTE — Telephone Encounter (Signed)
Reaching out to patient to offer assistance regarding upcoming cardiac imaging study; pt verbalizes understanding of appt date/time, parking situation and where to check in, pre-test NPO status and medications ordered, and verified current allergies; name and call back number provided for further questions should they arise  Gordy Clement RN Navigator Cardiac Imaging [facility] [phone] office [phone] cell  Patient to take '25mg'$  metoprolol tartrate two hours prior to her cardiac CT Scan.

## 2021-12-25 ENCOUNTER — Ambulatory Visit
Admission: RE | Admit: 2021-12-25 | Discharge: 2021-12-25 | Disposition: A | Discharge status: Home / Self Care | Payer: Medicare Other | Source: Ambulatory Visit | Attending: Internal Medicine | Admitting: Internal Medicine

## 2021-12-25 DIAGNOSIS — I208 Other forms of angina pectoris: Secondary | ICD-10-CM | POA: Diagnosis not present

## 2021-12-25 DIAGNOSIS — I2510 Atherosclerotic heart disease of native coronary artery without angina pectoris: Secondary | ICD-10-CM

## 2021-12-25 LAB — POCT I-STAT CREATININE: Creatinine, Ser: 0.7 mg/dL (ref 0.44–1.00)

## 2021-12-25 MED ORDER — NITROGLYCERIN 0.4 MG SL SUBL
0.8000 mg | SUBLINGUAL_TABLET | Freq: Once | SUBLINGUAL | Status: AC
Start: 2021-12-25 — End: 2021-12-25
  Administered 2021-12-25: 0.8 mg via SUBLINGUAL

## 2021-12-25 MED ORDER — IOHEXOL 350 MG/ML SOLN
100.0000 mL | Freq: Once | INTRAVENOUS | Status: AC | PRN
  Administered 2021-12-25: 75 mL via INTRAVENOUS

## 2021-12-25 NOTE — Progress Notes (Signed)
Patient tolerated procedure well. Ambulate w/o difficulty. Denies any lightheadedness or being dizzy. Pt denies any pain at this time. Sitting in chair, drinking water provided. Pt is encouraged to drink additional water throughout the day and reason explained to patient. Patient verbalized understanding and all questions answered. ABC intact. No further needs at this time. Discharge from procedure area w/o issues.  

## 2021-12-28 IMAGING — CR DG CHEST 2V
2 series · 2 of 2 positions shown · non-contrast
Comparison: 03/12/2019

CLINICAL DATA: Chest pain

EXAM:
CHEST - 2 VIEW

[chest pa]
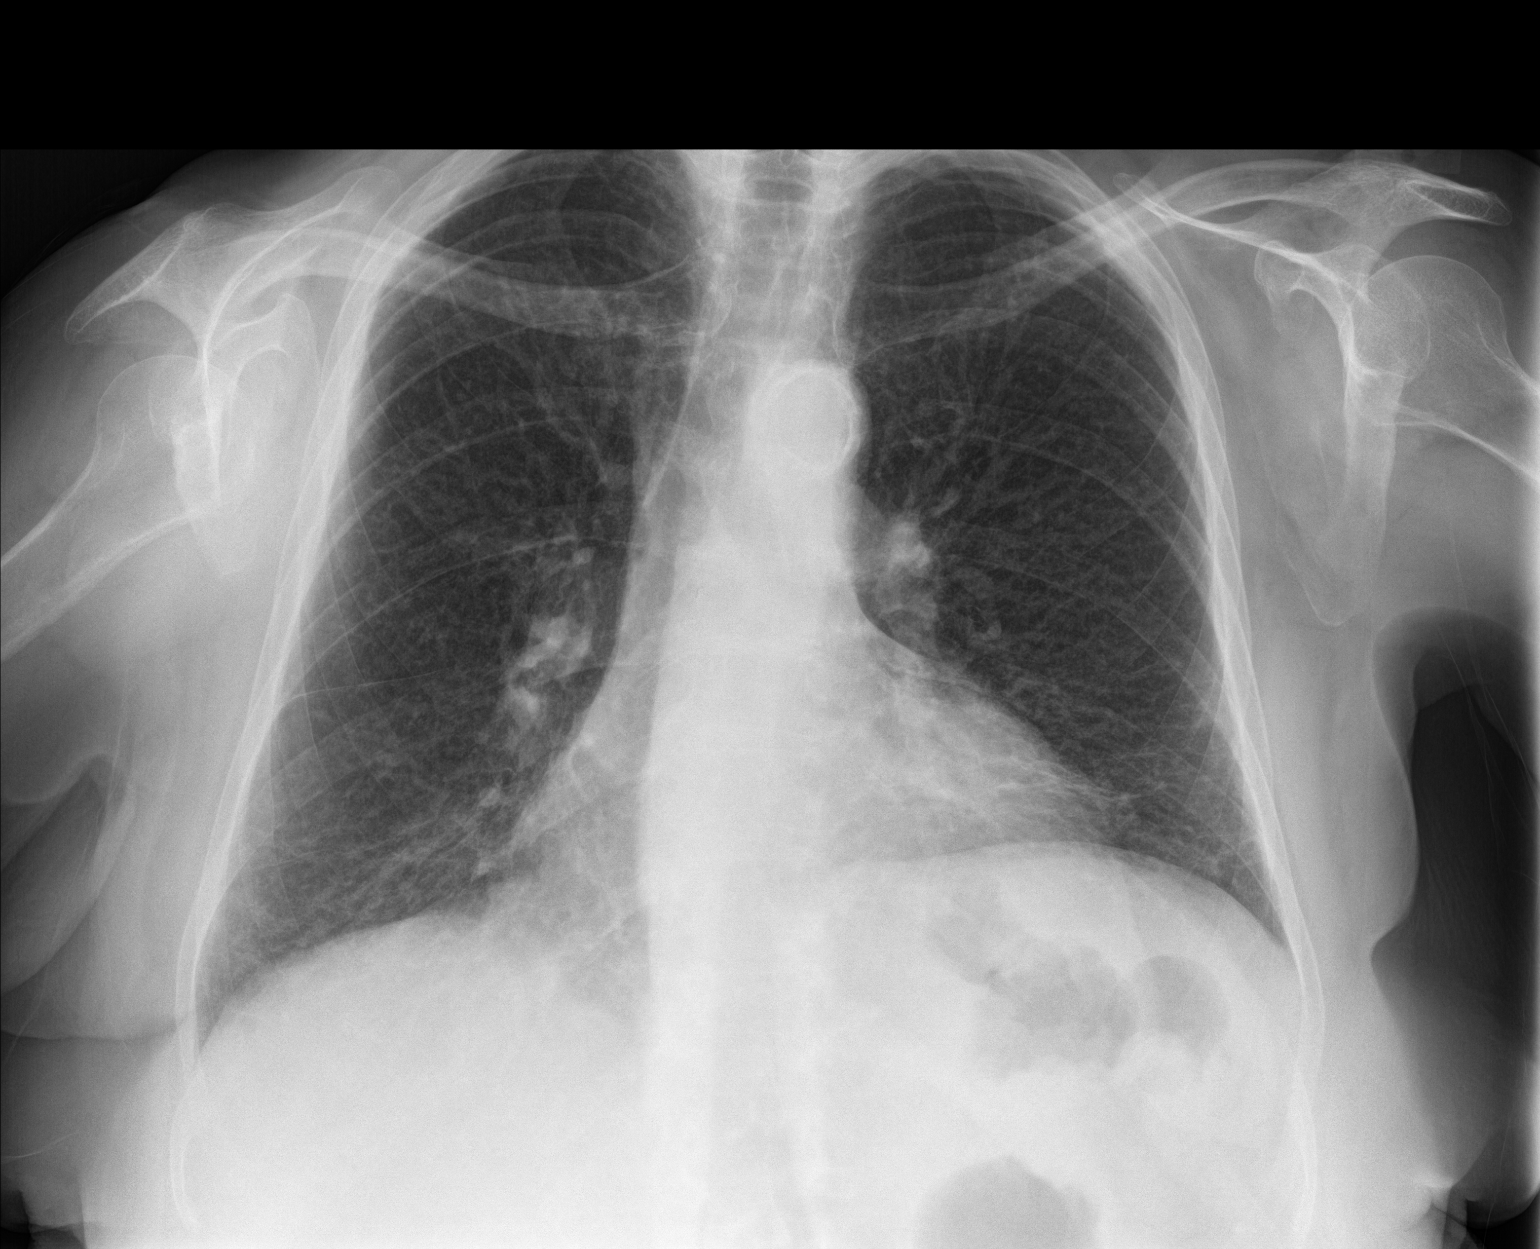

[chest lat]
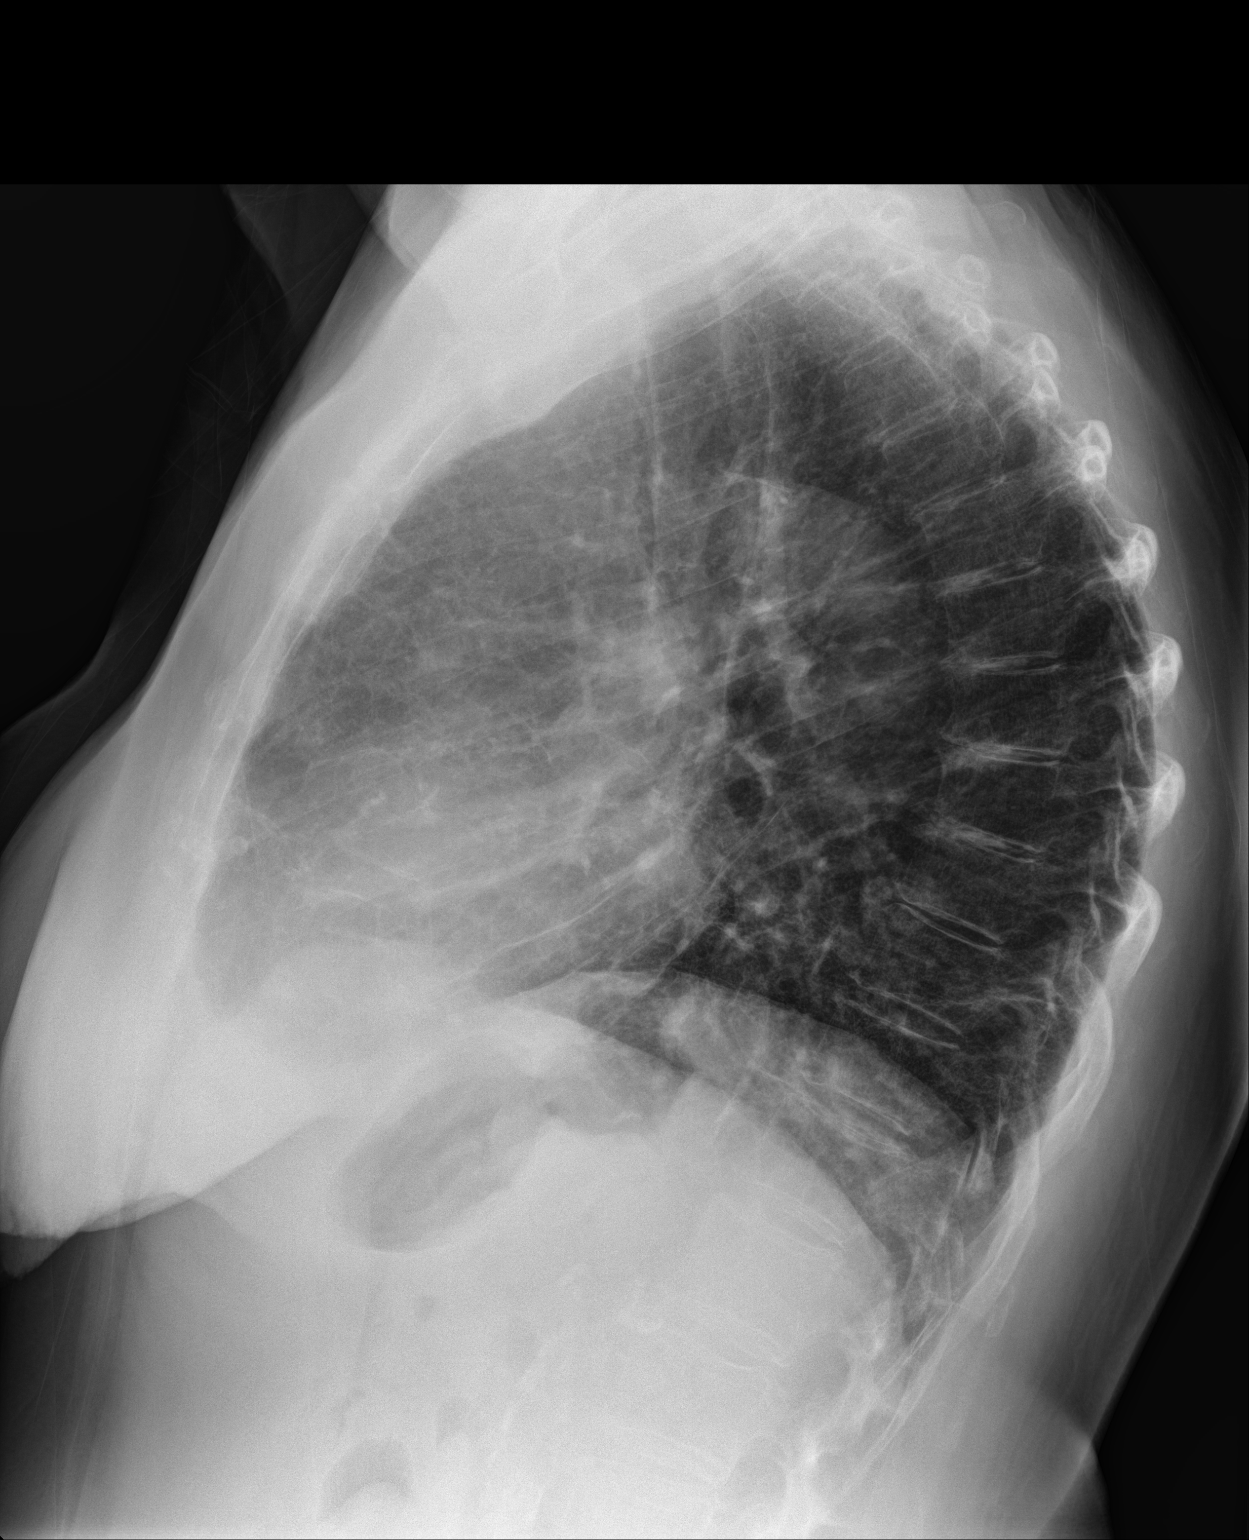

[2 of 2 positions shown; findings below may reference images not displayed]

FINDINGS: Cardiac shadow is stable. Aortic calcifications are noted. The lungs
are well aerated bilaterally with diffuse interstitial changes as
well as chronic fibrotic changes in the left base stable over
multiple previous exams. No bony abnormality is noted.
IMPRESSION: Chronic scarring in the left base.  No acute abnormality noted.

## 2022-01-26 DIAGNOSIS — R9430 Abnormal result of cardiovascular function study, unspecified: Secondary | ICD-10-CM

## 2022-01-31 ENCOUNTER — Ambulatory Visit
Admission: RE | Admit: 2022-01-31 | Discharge: 2022-01-31 | Disposition: A | Discharge status: Home / Self Care | Payer: Medicare Other | Attending: Internal Medicine | Admitting: Internal Medicine

## 2022-01-31 ENCOUNTER — Encounter
Admission: RE | Disposition: A | Discharge status: Home / Self Care | Payer: Self-pay | Source: Home / Self Care | Attending: Internal Medicine

## 2022-01-31 ENCOUNTER — Encounter: Payer: Self-pay | Admitting: Internal Medicine

## 2022-01-31 ENCOUNTER — Other Ambulatory Visit: Payer: Self-pay

## 2022-01-31 DIAGNOSIS — I2584 Coronary atherosclerosis due to calcified coronary lesion: Secondary | ICD-10-CM | POA: Diagnosis not present

## 2022-01-31 DIAGNOSIS — R9430 Abnormal result of cardiovascular function study, unspecified: Secondary | ICD-10-CM

## 2022-01-31 DIAGNOSIS — I2510 Atherosclerotic heart disease of native coronary artery without angina pectoris: Secondary | ICD-10-CM | POA: Diagnosis not present

## 2022-01-31 HISTORY — PX: LEFT HEART CATH AND CORONARY ANGIOGRAPHY: CATH118249

## 2022-01-31 LAB — CARDIAC CATHETERIZATION: Cath EF Quantitative: 55 %

## 2022-01-31 SURGERY — LEFT HEART CATH AND CORONARY ANGIOGRAPHY
Anesthesia: Moderate Sedation | Laterality: Left

## 2022-01-31 MED ORDER — ONDANSETRON HCL 4 MG/2ML IJ SOLN: 4.0000 mg | Freq: Four times a day (QID) | INTRAMUSCULAR | Status: DC | PRN

## 2022-01-31 MED ORDER — HYDRALAZINE HCL 20 MG/ML IJ SOLN: 10.0000 mg | INTRAMUSCULAR | Status: DC | PRN

## 2022-01-31 MED ORDER — LIDOCAINE HCL (PF) 1 % IJ SOLN
INTRAMUSCULAR | Status: DC | PRN
  Administered 2022-01-31: 2 mL

## 2022-01-31 MED ORDER — LABETALOL HCL 5 MG/ML IV SOLN: 10.0000 mg | INTRAVENOUS | Status: DC | PRN

## 2022-01-31 MED ORDER — SODIUM CHLORIDE 0.9 % WEIGHT BASED INFUSION: 1.0000 mL/kg/h | INTRAVENOUS | Status: DC

## 2022-01-31 MED ORDER — SODIUM CHLORIDE 0.9% FLUSH
3.0000 mL | INTRAVENOUS | Status: DC | PRN
Start: 2022-01-31 — End: 2022-01-31

## 2022-01-31 MED ORDER — SODIUM CHLORIDE 0.9 % IV SOLN: 250.0000 mL | INTRAVENOUS | Status: DC | PRN

## 2022-01-31 MED ORDER — ASPIRIN 81 MG PO CHEW
CHEWABLE_TABLET | ORAL | Status: DC
Start: 2022-01-31 — End: 2022-01-31
  Filled 2022-01-31: qty 1

## 2022-01-31 MED ORDER — IOHEXOL 300 MG/ML  SOLN
INTRAMUSCULAR | Status: DC | PRN
  Administered 2022-01-31: 80 mL

## 2022-01-31 MED ORDER — MIDAZOLAM HCL 2 MG/2ML IJ SOLN
INTRAMUSCULAR | Status: AC
  Filled 2022-01-31: qty 2

## 2022-01-31 MED ORDER — MIDAZOLAM HCL 2 MG/2ML IJ SOLN
INTRAMUSCULAR | Status: DC | PRN
  Administered 2022-01-31: 1 mg via INTRAVENOUS

## 2022-01-31 MED ORDER — SODIUM CHLORIDE 0.9% FLUSH: 3.0000 mL | Freq: Two times a day (BID) | INTRAVENOUS | Status: DC

## 2022-01-31 MED ORDER — ACETAMINOPHEN 325 MG PO TABS
650.0000 mg | ORAL_TABLET | ORAL | Status: DC | PRN
Start: 2022-01-31 — End: 2022-01-31

## 2022-01-31 MED ORDER — SODIUM CHLORIDE 0.9% FLUSH: 3.0000 mL | INTRAVENOUS | Status: DC | PRN

## 2022-01-31 MED ORDER — HEPARIN (PORCINE) IN NACL 1000-0.9 UT/500ML-% IV SOLN
INTRAVENOUS | Status: AC
  Filled 2022-01-31: qty 1000

## 2022-01-31 MED ORDER — SODIUM CHLORIDE 0.9 % WEIGHT BASED INFUSION
3.0000 mL/kg/h | INTRAVENOUS | Status: AC
  Administered 2022-01-31: 3 mL/kg/h via INTRAVENOUS

## 2022-01-31 MED ORDER — FENTANYL CITRATE (PF) 100 MCG/2ML IJ SOLN
INTRAMUSCULAR | Status: DC | PRN
  Administered 2022-01-31: 25 ug via INTRAVENOUS

## 2022-01-31 MED ORDER — HEPARIN SODIUM (PORCINE) 1000 UNIT/ML IJ SOLN
INTRAMUSCULAR | Status: AC
  Filled 2022-01-31: qty 10

## 2022-01-31 MED ORDER — VERAPAMIL HCL 2.5 MG/ML IV SOLN
INTRAVENOUS | Status: AC
  Filled 2022-01-31: qty 2

## 2022-01-31 MED ORDER — ASPIRIN 81 MG PO CHEW
81.0000 mg | CHEWABLE_TABLET | ORAL | Status: AC
  Administered 2022-01-31: 81 mg via ORAL

## 2022-01-31 MED ORDER — HEPARIN SODIUM (PORCINE) 1000 UNIT/ML IJ SOLN
INTRAMUSCULAR | Status: DC | PRN
  Administered 2022-01-31: 4000 [IU] via INTRAVENOUS

## 2022-01-31 MED ORDER — HEPARIN (PORCINE) IN NACL 2000-0.9 UNIT/L-% IV SOLN
INTRAVENOUS | Status: DC | PRN
Start: 2022-01-31 — End: 2022-01-31
  Administered 2022-01-31: 1000 mL

## 2022-01-31 MED ORDER — FENTANYL CITRATE (PF) 100 MCG/2ML IJ SOLN
INTRAMUSCULAR | Status: AC
Start: 2022-01-31 — End: ?
  Filled 2022-01-31: qty 2

## 2022-01-31 MED ORDER — VERAPAMIL HCL 2.5 MG/ML IV SOLN
INTRAVENOUS | Status: DC | PRN
  Administered 2022-01-31: 2.5 mg via INTRA_ARTERIAL

## 2022-01-31 MED ORDER — LIDOCAINE HCL 1 % IJ SOLN
INTRAMUSCULAR | Status: AC
  Filled 2022-01-31: qty 20

## 2022-01-31 SURGICAL SUPPLY — 14 items
BAND ZEPHYR COMPRESS 30 LONG (HEMOSTASIS) IMPLANT
CATH 5FR JL3.5 JR4 ANG PIG MP (CATHETERS) IMPLANT
COVER PRB 48X5XTLSCP FOLD TPE (BAG) IMPLANT
COVER PROBE 5X48 (BAG) ×1
DRAPE BRACHIAL (DRAPES) IMPLANT
GLIDESHEATH SLEND SS 6F .021 (SHEATH) IMPLANT
GUIDEWIRE INQWIRE 1.5J.035X260 (WIRE) IMPLANT
INQWIRE 1.5J .035X260CM (WIRE) ×1
KIT SYRINGE INJ CVI SPIKEX1 (MISCELLANEOUS) IMPLANT
PACK CARDIAC CATH (CUSTOM PROCEDURE TRAY) ×1 IMPLANT
PROTECTION STATION PRESSURIZED (MISCELLANEOUS) ×1
SET ATX SIMPLICITY (MISCELLANEOUS) IMPLANT
STATION PROTECTION PRESSURIZED (MISCELLANEOUS) IMPLANT
WIRE HITORQ VERSACORE ST 145CM (WIRE) IMPLANT

## 2022-02-01 ENCOUNTER — Encounter: Payer: Self-pay | Admitting: Internal Medicine

## 2022-04-05 ENCOUNTER — Other Ambulatory Visit
Admission: RE | Admit: 2022-04-05 | Discharge: 2022-04-05 | Disposition: A | Discharge status: Home / Self Care | Payer: Medicare Other | Source: Ambulatory Visit | Attending: Student | Admitting: Student

## 2022-04-05 DIAGNOSIS — R0989 Other specified symptoms and signs involving the circulatory and respiratory systems: Secondary | ICD-10-CM | POA: Diagnosis present

## 2022-04-05 DIAGNOSIS — R0602 Shortness of breath: Secondary | ICD-10-CM | POA: Insufficient documentation

## 2022-04-05 DIAGNOSIS — R058 Other specified cough: Secondary | ICD-10-CM | POA: Diagnosis present

## 2022-04-05 LAB — BRAIN NATRIURETIC PEPTIDE: B Natriuretic Peptide: 310.6 pg/mL — ABNORMAL HIGH (ref 0.0–100.0)

## 2022-07-31 DIAGNOSIS — M79606 Pain in leg, unspecified: Secondary | ICD-10-CM | POA: Insufficient documentation

## 2022-07-31 NOTE — Progress Notes (Addendum)
MRN : [id number]  Traci Bowen is a 82 y.o. ([birth year]) female who presents with chief complaint of check circulation.  History of Present Illness:   Patient is seen for evaluation of leg swelling with some reddish discoloration. The patient first noticed the swelling after she had a coronary stent placed. The swelling isn't associated with significant pain.  There has been an increasing amount of  discoloration noted by the patient. The patient notes that in the morning the legs are improved but they steadily worsened throughout the course of the day. Elevation seems to make the swelling of the legs better, dependency makes them much worse.   There is no history of ulcerations associated with the swelling.   The patient notes a changes in their medications since the stent (duel antiplatelet therapy).  The patient has not been wearing graduated compression.  The patient has no had any past angiography, interventions or vascular surgery.  The patient denies a history of DVT or PE. There is no prior history of phlebitis. There is no history of primary lymphedema.  There is no history of radiation treatment to the groin or pelvis No history of malignancies. No history of trauma or groin or pelvic surgery. 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   The patient denies rest pain or dangling of an extremity off the side of the bed during the night for relief. No open wounds or sores at this time. No history of DVT or phlebitis. No prior vascular interventions or surgeries.  ABI's are normal bilaterally   No outpatient medications have been marked as taking for the 08/02/22 encounter (Appointment) with Delana Meyer, Dolores Lory, MD.    Past Medical History:  Diagnosis Date   Hyperlipidemia    Hypertension    Neuropathy    Renal disorder    Spinal stenosis      Past Surgical History:  Procedure Laterality Date   CATARACT EXTRACTION Bilateral    EYE SURGERY Left march 2016   cysts removed    LEFT HEART CATH AND CORONARY ANGIOGRAPHY Left 01/31/2022   Procedure: LEFT HEART CATH AND CORONARY ANGIOGRAPHY;  Surgeon: Yolonda Kida, MD;  Location: [facility];  Service: Cardiovascular;  Laterality: Left;   LITHOTRIPSY     VAGINAL HYSTERECTOMY      Social History Social History   Tobacco Use   Smoking status: Former    Packs/day: 0.50    Years: 3.00    Total pack years: 1.50    Types: Cigarettes    Quit date: 1989    Years since quitting: 35.1   Smokeless tobacco: Never   Tobacco comments:    smoking cessation materials not required  Vaping Use   Vaping Use: Never used  Substance Use Topics   Alcohol use: No    Alcohol/week: 0.0 standard drinks of alcohol   Drug use: No    Family History Family History  Problem Relation Age of Onset   Diabetes Mother    Hypertension Mother    Throat cancer Father    Colon cancer Brother    Stroke Sister    Heart attack Brother     Allergies  Allergen Reactions   Betadine [Povidone Iodine]    Doxycycline Nausea And Vomiting   Penicillins  REVIEW OF SYSTEMS (Negative unless checked)  Constitutional: '[]'$ Weight loss  '[]'$ Fever  '[]'$ Chills Cardiac: '[]'$ Chest pain   '[]'$ Chest pressure   '[]'$ Palpitations   '[]'$ Shortness of breath when laying flat   '[]'$ Shortness of breath with exertion. Vascular:  '[x]'$ Pain in legs with walking   '[]'$ Pain in legs at rest  '[]'$ History of DVT   '[]'$ Phlebitis   '[]'$ Swelling in legs   '[]'$ Varicose veins   '[]'$ Non-healing ulcers Pulmonary:   '[]'$ Uses home oxygen   '[]'$ Productive cough   '[]'$ Hemoptysis   '[]'$ Wheeze  '[]'$ COPD   '[]'$ Asthma Neurologic:  '[]'$ Dizziness   '[]'$ Seizures   '[]'$ History of stroke   '[]'$ History of TIA  '[]'$ Aphasia   '[]'$ Vissual changes   '[]'$ Weakness or numbness in arm   '[x]'$ Weakness or numbness in leg Musculoskeletal:   '[]'$ Joint swelling   '[]'$ Joint pain   '[]'$ Low back  pain Hematologic:  '[]'$ Easy bruising  '[]'$ Easy bleeding   '[]'$ Hypercoagulable state   '[]'$ Anemic Gastrointestinal:  '[]'$ Diarrhea   '[]'$ Vomiting  '[]'$ Gastroesophageal reflux/heartburn   '[]'$ Difficulty swallowing. Genitourinary:  '[]'$ Chronic kidney disease   '[]'$ Difficult urination  '[]'$ Frequent urination   '[]'$ Blood in urine Skin:  '[]'$ Rashes   '[]'$ Ulcers  Psychological:  '[]'$ History of anxiety   '[]'$  History of major depression.  Physical Examination  There were no vitals filed for this visit. There is no height or weight on file to calculate BMI. Gen: WD/WN, NAD Head: [place]/AT, No temporalis wasting.  Ear/Nose/Throat: Hearing grossly intact, nares w/o erythema or drainage Eyes: PER, EOMI, sclera nonicteric.  Neck: Supple, no masses.  No bruit or JVD.  Pulmonary:  Good air movement, no audible wheezing, no use of accessory muscles.  Cardiac: RRR, normal S1, S2, no Murmurs. Vascular:  scattered varicosities present bilaterally.  Mild venous stasis changes to the legs bilaterally.  2-3+ soft pitting edema, CEAP [id number]  Vessel Right Left  Radial Palpable Palpable  PT Palpable Palpable  DP Palpable Palpable  Gastrointestinal: soft, non-distended. No guarding/no peritoneal signs.  Musculoskeletal: M/S 5/5 throughout.  No visible deformity.  Neurologic: CN 2-12 intact. Pain and light touch intact in extremities.  Symmetrical.  Speech is fluent. Motor exam as listed above. Psychiatric: Judgment intact, Mood & affect appropriate for pt's clinical situation. Dermatologic: No rashes or ulcers noted.  No changes consistent with cellulitis.   CBC Lab Results  Component Value Date   WBC 8.8 06/14/2017   HGB 15.6 06/14/2017   HCT 45.6 06/14/2017   MCV 86.6 06/14/2017   PLT 279 06/14/2017    BMET    Component Value Date/Time   NA 145 (H) 10/01/2019 1026   K 4.0 10/01/2019 1026   CL 101 10/01/2019 1026   CO2 29 10/01/2019 1026   GLUCOSE 112 (H) 10/01/2019 1026   GLUCOSE 140 (H) 06/14/2017 1203   BUN 10 10/01/2019  1026   CREATININE 0.70 12/25/2021 0900   CALCIUM 10.8 (H) 10/01/2019 1026   GFRNONAA 84 10/01/2019 1026   GFRAA 97 10/01/2019 1026   CrCl cannot be calculated (Patient's most recent lab result is older than the maximum 21 days allowed.).  COAG No results found for: "INR", "PROTIME"  Radiology No results found.   Assessment/Plan 1. Chronic venous insufficiency Recommend:  No surgery or intervention at this point in time.  I have reviewed my discussion with the patient regarding venous insufficiency and why it causes symptoms. I have discussed with the patient the chronic skin changes that accompany venous insufficiency and the long term sequela such as ulceration. Patient will contnue wearing graduated compression stockings on a  daily basis, as this has provided excellent control of his edema. The patient will put the stockings on first thing in the morning and removing them in the evening. The patient is reminded not to sleep in the stockings.  In addition, behavioral modification including elevation during the day will be initiated. Exercise is strongly encouraged.  Previous duplex ultrasound of the lower extremities shows normal deep system, no significant superficial reflux was identified.  Given the patient's good control and lack of any problems regarding the venous insufficiency and lymphedema a lymph pump in not need at this time.    The patient will follow up with me PRN should anything change.  The patient voices agreement with this plan.  2. Mixed hyperlipidemia Continue statin as ordered and reviewed, no changes at this time  3. Essential hypertension Continue antihypertensive medications as already ordered, these medications have been reviewed and there are no changes at this time.  4. Coronary artery disease involving native coronary artery of native heart with angina pectoris ([place]) Continue cardiac and antihypertensive medications as already ordered and reviewed,  no changes at this time.  Continue statin as ordered and reviewed, no changes at this time  Nitrates PRN for chest pain     Hortencia Pilar, MD  07/31/2022 4:33 PM

## 2022-08-01 ENCOUNTER — Other Ambulatory Visit (INDEPENDENT_AMBULATORY_CARE_PROVIDER_SITE_OTHER): Payer: Self-pay | Admitting: Vascular Surgery

## 2022-08-01 DIAGNOSIS — I739 Peripheral vascular disease, unspecified: Secondary | ICD-10-CM

## 2022-08-02 ENCOUNTER — Ambulatory Visit (INDEPENDENT_AMBULATORY_CARE_PROVIDER_SITE_OTHER): Payer: Medicare Other

## 2022-08-02 ENCOUNTER — Ambulatory Visit (INDEPENDENT_AMBULATORY_CARE_PROVIDER_SITE_OTHER): Payer: Medicare Other | Admitting: Vascular Surgery

## 2022-08-02 ENCOUNTER — Encounter (INDEPENDENT_AMBULATORY_CARE_PROVIDER_SITE_OTHER): Payer: Self-pay | Admitting: Vascular Surgery

## 2022-08-02 VITALS — BP 127/71 | HR 68 | Resp 18 | Ht 66.0 in | Wt 178.4 lb

## 2022-08-02 DIAGNOSIS — I25119 Atherosclerotic heart disease of native coronary artery with unspecified angina pectoris: Secondary | ICD-10-CM

## 2022-08-02 DIAGNOSIS — I10 Essential (primary) hypertension: Secondary | ICD-10-CM

## 2022-08-02 DIAGNOSIS — I739 Peripheral vascular disease, unspecified: Secondary | ICD-10-CM | POA: Diagnosis not present

## 2022-08-02 DIAGNOSIS — I872 Venous insufficiency (chronic) (peripheral): Secondary | ICD-10-CM

## 2022-08-02 DIAGNOSIS — E782 Mixed hyperlipidemia: Secondary | ICD-10-CM

## 2022-08-02 DIAGNOSIS — M79606 Pain in leg, unspecified: Secondary | ICD-10-CM

## 2022-08-03 ENCOUNTER — Encounter (INDEPENDENT_AMBULATORY_CARE_PROVIDER_SITE_OTHER): Payer: Self-pay | Admitting: Vascular Surgery

## 2022-08-03 DIAGNOSIS — I2510 Atherosclerotic heart disease of native coronary artery without angina pectoris: Secondary | ICD-10-CM | POA: Insufficient documentation

## 2022-08-03 DIAGNOSIS — I872 Venous insufficiency (chronic) (peripheral): Secondary | ICD-10-CM | POA: Insufficient documentation

## 2023-04-19 IMAGING — MR MR PELVIS W/O CM
4 of 5 series · 22 of 48 positions shown · non-contrast
Comparison: MRI lumbar spine 10/28/2020, CT abdomen pelvis
11/05/2019

CLINICAL DATA: Abnormal MRI, marrow edema in the right sacrum

EXAM:
MRI PELVIS WITHOUT CONTRAST
TECHNIQUE: Multiplanar multisequence MR imaging of the pelvis was performed. No
intravenous contrast was administered.

[Series 8: T1 · axial · 4.0mm · 0.74mm/px · z∈[-123,+132]mm · 9 of 52 slices shown (1 of 2)]
[im 1/52]
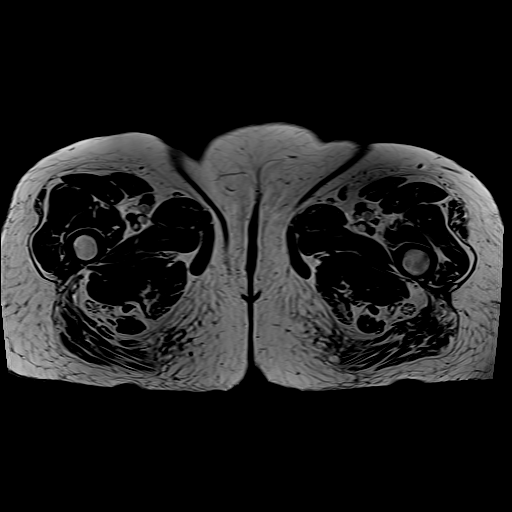
[im 7/52]
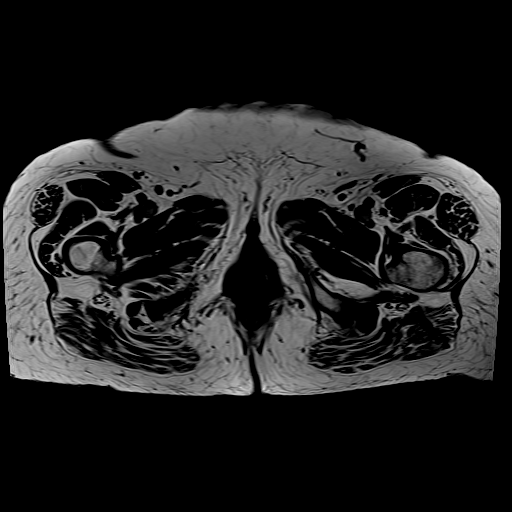
[im 13/52]
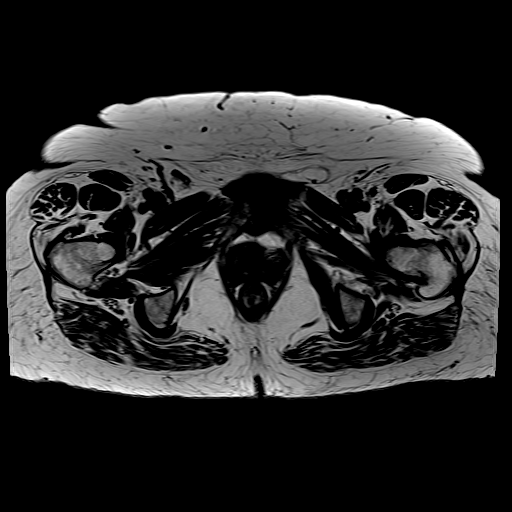
[im 20/52]
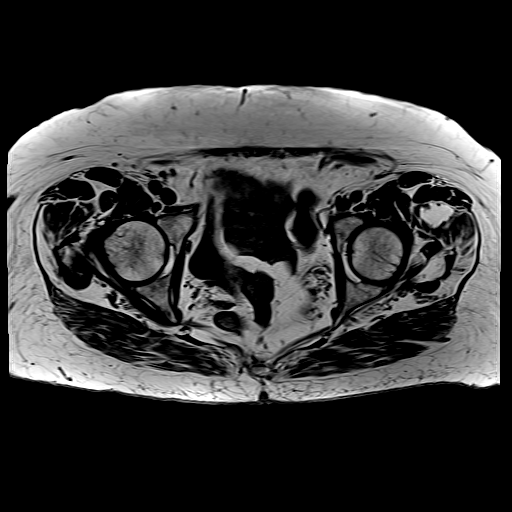
[im 26/52]
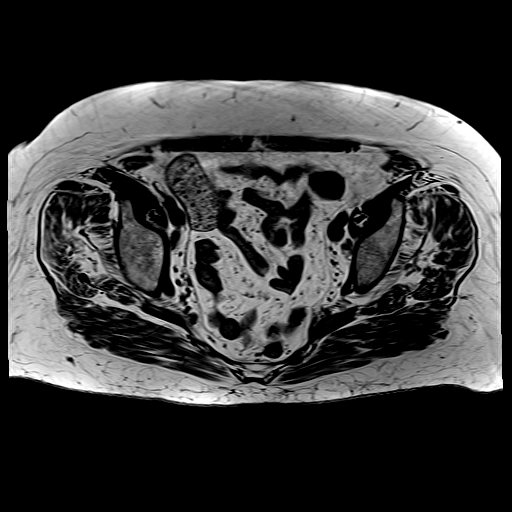
[im 32/52]
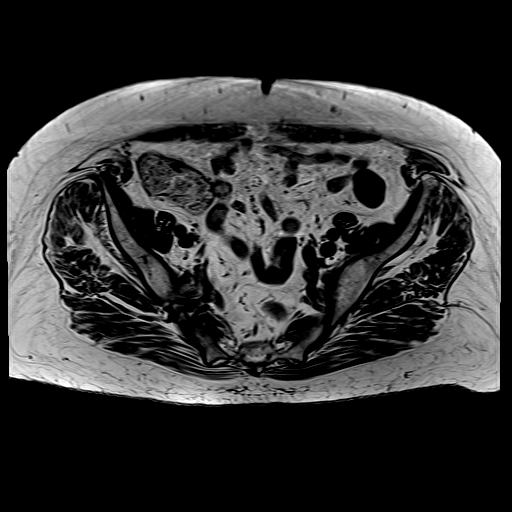
[im 39/52]
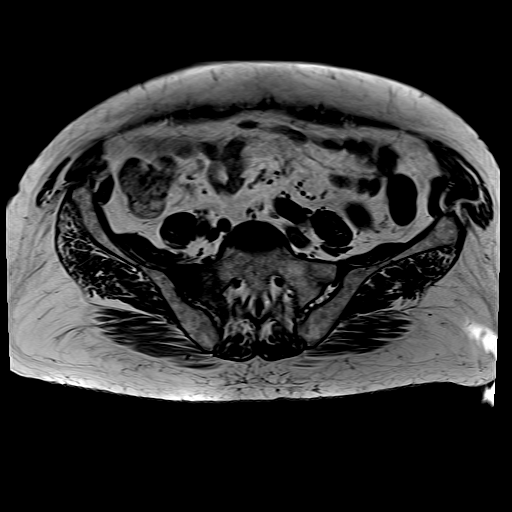
[im 45/52]
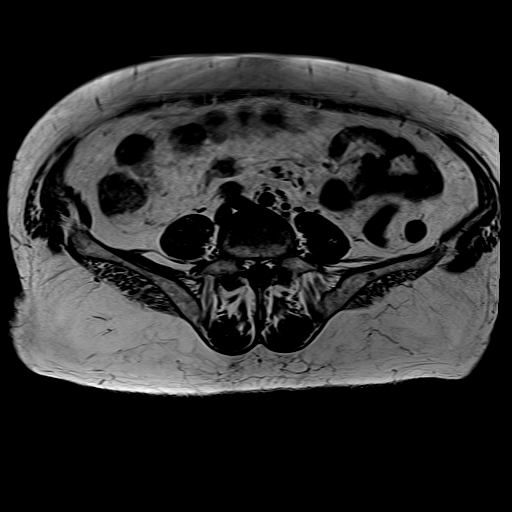
[im 52/52]
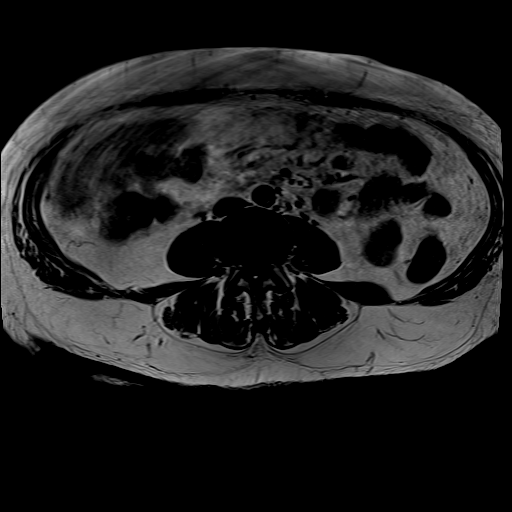

[Series 9: T2 fat-sat · axial · 4.0mm · 0.74mm/px · z∈[-123,+97]mm · 7 of 52 slices shown]
[im 1/52]
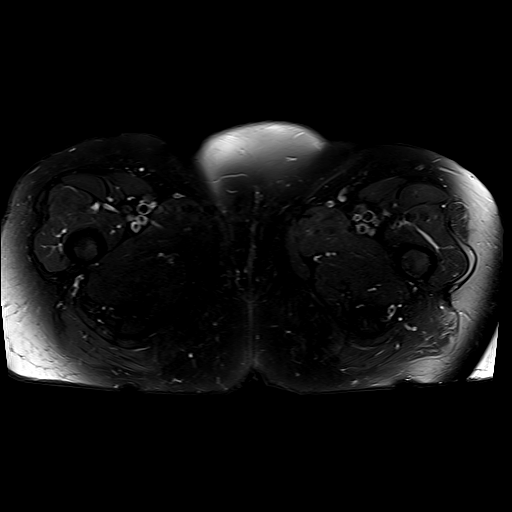
[im 7/52]
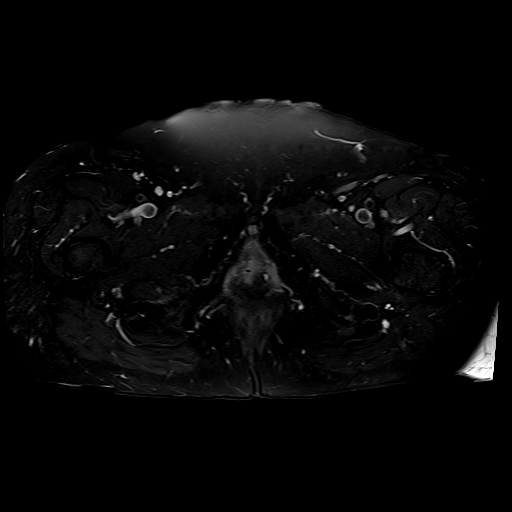
[im 13/52]
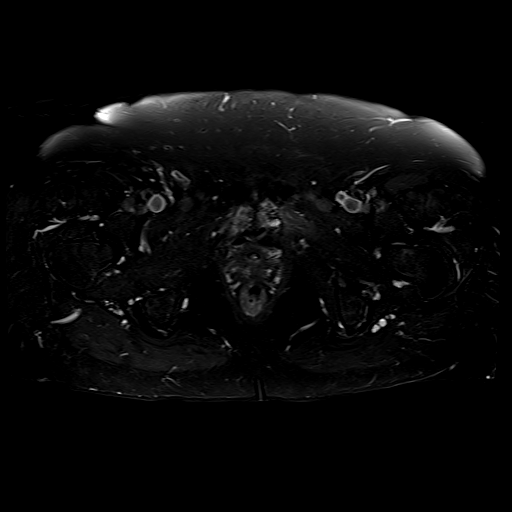
[im 20/52]
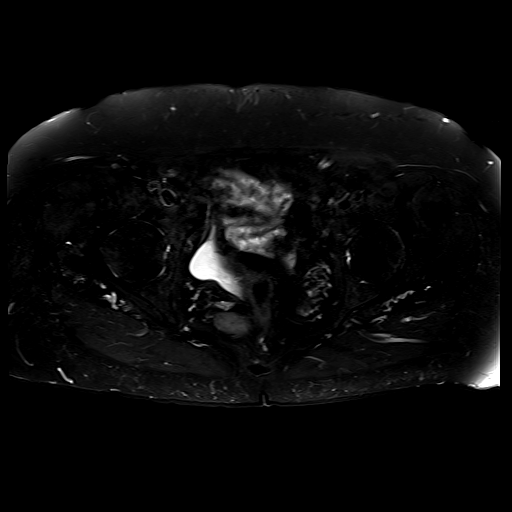
[im 26/52]
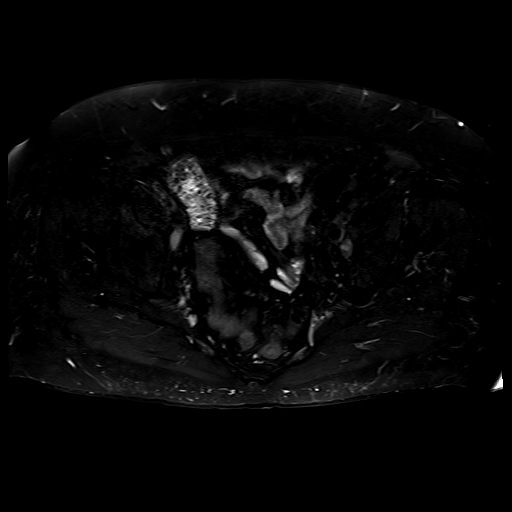
[im 32/52]
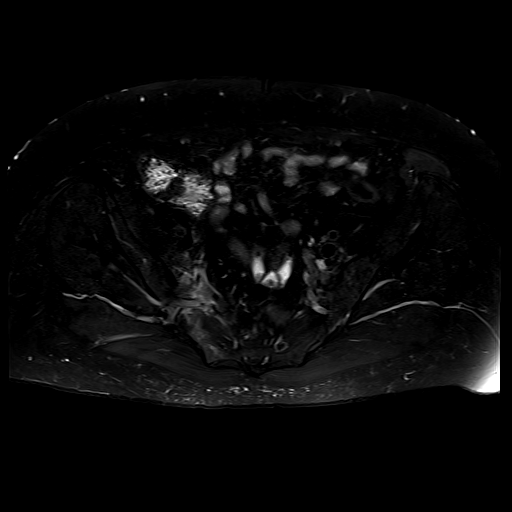
[im 45/52]
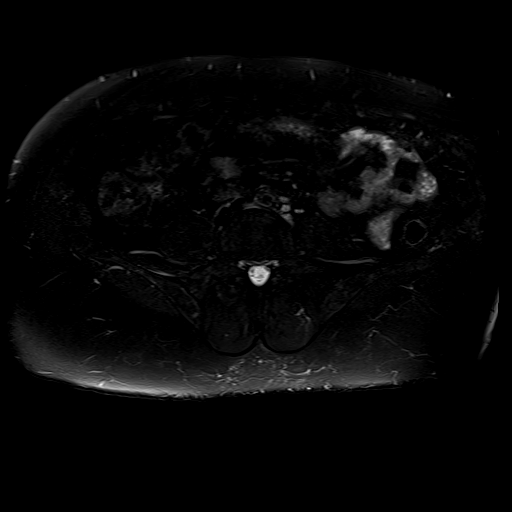

[Series 10: STIR · coronal · 4.0mm · 1.25mm/px · 3 of 40 slices shown]
[im 6/40]
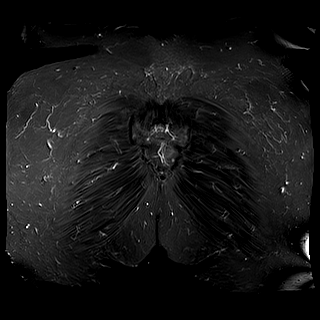
[im 23/40]
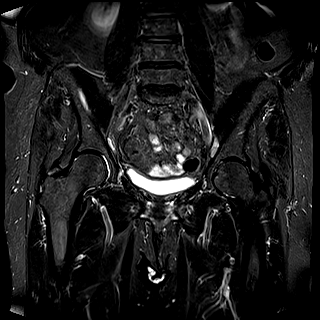
[im 34/40]
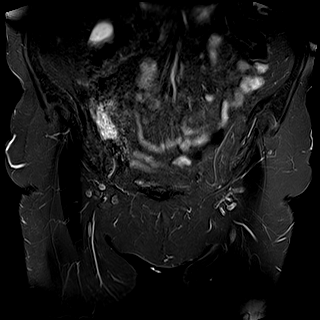

[Series 11: T1 · coronal · 4.0mm · 1.25mm/px · 3 of 40 slices shown (2 of 2)]
[im 6/40]
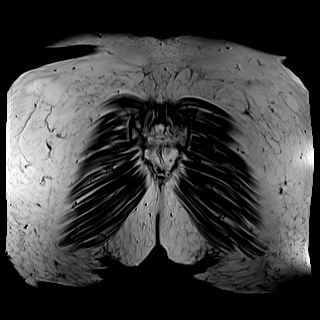
[im 23/40]
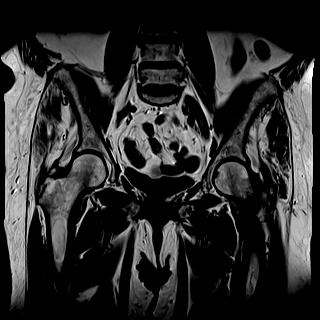
[im 34/40]
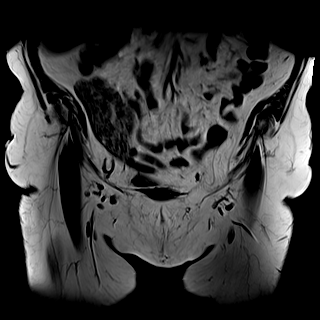

[22 of 48 positions shown; findings below may reference images not displayed]

FINDINGS: Urinary Tract:  No abnormality visualized.

Bowel:  Unremarkable visualized pelvic bowel loops.

Vascular/Lymphatic: No pathologically enlarged lymph nodes. No
significant vascular abnormality seen.

Reproductive:  Prior hysterectomy.

Other:  None

Musculoskeletal: There is a nondisplaced right sacral ala fracture
in zone 1. There is surrounding bony edema. No evidence of neural
foramina involvement. Additionally, there is a nondisplaced fracture
of the left pubic body near the pubic symphysis. And a nondisplaced
fracture of the right inferior pubic ramus. There is bony edema
within the inferior aspect of the of the right pubic body, which
correlates with area of sclerotic change on prior CT. There is no
evidence of proximal femur fracture or avascular necrosis. There is
mild bilateral hip degenerative changes. No evidence of trochanteric
bursitis. No evidence of gluteal tendon tear. The proximal
hamstrings just demonstrate mild signal which may be normal for age.
Mild reactive edema signal within the proximal left adductor
muscles.
IMPRESSION: Nondisplaced right sacral ala fracture in zone 1, without evidence
of neural foraminal involvement. This has the appearance of an
insufficiency fracture.

Nondisplaced fractures involving the left pubic body near the pubic
symphysis and right inferior pubic ramus.

Bony edema along the inferior aspect of the right pubic body as
well, favored to be related to degenerative change of the pubic
symphysis, but additional nondisplaced fracture is possible.

## 2023-07-11 DIAGNOSIS — I2089 Other forms of angina pectoris: Secondary | ICD-10-CM | POA: Diagnosis not present

## 2023-07-11 DIAGNOSIS — I700 Atherosclerosis of aorta: Secondary | ICD-10-CM | POA: Diagnosis not present

## 2023-07-11 DIAGNOSIS — I25119 Atherosclerotic heart disease of native coronary artery with unspecified angina pectoris: Secondary | ICD-10-CM | POA: Diagnosis not present

## 2023-08-20 DIAGNOSIS — H34831 Tributary (branch) retinal vein occlusion, right eye, with macular edema: Secondary | ICD-10-CM | POA: Diagnosis not present

## 2023-08-20 DIAGNOSIS — H35033 Hypertensive retinopathy, bilateral: Secondary | ICD-10-CM | POA: Diagnosis not present

## 2023-09-03 DIAGNOSIS — Z87891 Personal history of nicotine dependence: Secondary | ICD-10-CM | POA: Diagnosis not present

## 2023-09-03 DIAGNOSIS — R0602 Shortness of breath: Secondary | ICD-10-CM | POA: Diagnosis not present

## 2023-09-03 DIAGNOSIS — Z955 Presence of coronary angioplasty implant and graft: Secondary | ICD-10-CM | POA: Diagnosis not present

## 2023-09-03 DIAGNOSIS — I25119 Atherosclerotic heart disease of native coronary artery with unspecified angina pectoris: Secondary | ICD-10-CM | POA: Diagnosis not present

## 2023-09-03 DIAGNOSIS — R0789 Other chest pain: Secondary | ICD-10-CM | POA: Diagnosis not present

## 2023-09-03 DIAGNOSIS — I2510 Atherosclerotic heart disease of native coronary artery without angina pectoris: Secondary | ICD-10-CM | POA: Diagnosis not present

## 2023-09-03 DIAGNOSIS — E782 Mixed hyperlipidemia: Secondary | ICD-10-CM | POA: Diagnosis not present

## 2023-09-03 DIAGNOSIS — I872 Venous insufficiency (chronic) (peripheral): Secondary | ICD-10-CM | POA: Diagnosis not present

## 2023-09-03 DIAGNOSIS — J439 Emphysema, unspecified: Secondary | ICD-10-CM | POA: Diagnosis not present

## 2023-09-03 DIAGNOSIS — I10 Essential (primary) hypertension: Secondary | ICD-10-CM | POA: Diagnosis not present

## 2023-09-17 DIAGNOSIS — M810 Age-related osteoporosis without current pathological fracture: Secondary | ICD-10-CM | POA: Insufficient documentation

## 2023-10-01 DIAGNOSIS — H35033 Hypertensive retinopathy, bilateral: Secondary | ICD-10-CM | POA: Diagnosis not present

## 2023-10-01 DIAGNOSIS — H34831 Tributary (branch) retinal vein occlusion, right eye, with macular edema: Secondary | ICD-10-CM | POA: Diagnosis not present

## 2023-10-22 DIAGNOSIS — Z1331 Encounter for screening for depression: Secondary | ICD-10-CM | POA: Diagnosis not present

## 2023-10-22 DIAGNOSIS — R252 Cramp and spasm: Secondary | ICD-10-CM | POA: Diagnosis not present

## 2023-10-22 DIAGNOSIS — M25572 Pain in left ankle and joints of left foot: Secondary | ICD-10-CM | POA: Diagnosis not present

## 2023-10-22 DIAGNOSIS — R0982 Postnasal drip: Secondary | ICD-10-CM | POA: Diagnosis not present

## 2023-10-22 DIAGNOSIS — M25472 Effusion, left ankle: Secondary | ICD-10-CM | POA: Diagnosis not present

## 2023-10-28 DIAGNOSIS — I25119 Atherosclerotic heart disease of native coronary artery with unspecified angina pectoris: Secondary | ICD-10-CM | POA: Diagnosis not present

## 2023-10-28 DIAGNOSIS — E119 Type 2 diabetes mellitus without complications: Secondary | ICD-10-CM | POA: Diagnosis not present

## 2023-10-28 DIAGNOSIS — M19072 Primary osteoarthritis, left ankle and foot: Secondary | ICD-10-CM | POA: Diagnosis not present

## 2023-11-19 DIAGNOSIS — H35033 Hypertensive retinopathy, bilateral: Secondary | ICD-10-CM | POA: Diagnosis not present

## 2023-11-19 DIAGNOSIS — H34831 Tributary (branch) retinal vein occlusion, right eye, with macular edema: Secondary | ICD-10-CM | POA: Diagnosis not present

## 2023-12-23 DIAGNOSIS — M810 Age-related osteoporosis without current pathological fracture: Secondary | ICD-10-CM | POA: Diagnosis not present

## 2023-12-23 DIAGNOSIS — Z1331 Encounter for screening for depression: Secondary | ICD-10-CM | POA: Diagnosis not present

## 2023-12-23 DIAGNOSIS — I1310 Hypertensive heart and chronic kidney disease without heart failure, with stage 1 through stage 4 chronic kidney disease, or unspecified chronic kidney disease: Secondary | ICD-10-CM | POA: Diagnosis not present

## 2023-12-23 DIAGNOSIS — N1831 Chronic kidney disease, stage 3a: Secondary | ICD-10-CM | POA: Diagnosis not present

## 2023-12-23 DIAGNOSIS — I700 Atherosclerosis of aorta: Secondary | ICD-10-CM | POA: Diagnosis not present

## 2023-12-23 DIAGNOSIS — I480 Paroxysmal atrial fibrillation: Secondary | ICD-10-CM | POA: Diagnosis not present

## 2023-12-23 DIAGNOSIS — I25119 Atherosclerotic heart disease of native coronary artery with unspecified angina pectoris: Secondary | ICD-10-CM | POA: Diagnosis not present

## 2023-12-23 DIAGNOSIS — E538 Deficiency of other specified B group vitamins: Secondary | ICD-10-CM | POA: Diagnosis not present

## 2023-12-23 DIAGNOSIS — E1122 Type 2 diabetes mellitus with diabetic chronic kidney disease: Secondary | ICD-10-CM | POA: Diagnosis not present

## 2023-12-23 DIAGNOSIS — J439 Emphysema, unspecified: Secondary | ICD-10-CM | POA: Diagnosis not present

## 2023-12-23 DIAGNOSIS — Z09 Encounter for follow-up examination after completed treatment for conditions other than malignant neoplasm: Secondary | ICD-10-CM | POA: Diagnosis not present

## 2023-12-23 DIAGNOSIS — E785 Hyperlipidemia, unspecified: Secondary | ICD-10-CM | POA: Diagnosis not present

## 2023-12-31 DIAGNOSIS — H35033 Hypertensive retinopathy, bilateral: Secondary | ICD-10-CM | POA: Diagnosis not present

## 2023-12-31 DIAGNOSIS — H34831 Tributary (branch) retinal vein occlusion, right eye, with macular edema: Secondary | ICD-10-CM | POA: Diagnosis not present

## 2024-03-03 DIAGNOSIS — H34831 Tributary (branch) retinal vein occlusion, right eye, with macular edema: Secondary | ICD-10-CM | POA: Diagnosis not present

## 2024-03-12 DIAGNOSIS — Z23 Encounter for immunization: Secondary | ICD-10-CM | POA: Diagnosis not present

## 2024-04-03 DIAGNOSIS — I7121 Aneurysm of the ascending aorta, without rupture: Secondary | ICD-10-CM | POA: Diagnosis not present

## 2024-04-03 DIAGNOSIS — Z87891 Personal history of nicotine dependence: Secondary | ICD-10-CM | POA: Diagnosis not present

## 2024-04-03 DIAGNOSIS — I872 Venous insufficiency (chronic) (peripheral): Secondary | ICD-10-CM | POA: Diagnosis not present

## 2024-04-03 DIAGNOSIS — E782 Mixed hyperlipidemia: Secondary | ICD-10-CM | POA: Diagnosis not present

## 2024-04-03 DIAGNOSIS — R0602 Shortness of breath: Secondary | ICD-10-CM | POA: Diagnosis not present

## 2024-04-03 DIAGNOSIS — I10 Essential (primary) hypertension: Secondary | ICD-10-CM | POA: Diagnosis not present

## 2024-04-03 DIAGNOSIS — R0789 Other chest pain: Secondary | ICD-10-CM | POA: Diagnosis not present

## 2024-04-03 DIAGNOSIS — I480 Paroxysmal atrial fibrillation: Secondary | ICD-10-CM | POA: Diagnosis not present

## 2024-04-03 DIAGNOSIS — Z955 Presence of coronary angioplasty implant and graft: Secondary | ICD-10-CM | POA: Diagnosis not present

## 2024-04-03 DIAGNOSIS — R55 Syncope and collapse: Secondary | ICD-10-CM | POA: Diagnosis not present

## 2024-04-03 DIAGNOSIS — I25119 Atherosclerotic heart disease of native coronary artery with unspecified angina pectoris: Secondary | ICD-10-CM | POA: Diagnosis not present

## 2024-04-03 DIAGNOSIS — E119 Type 2 diabetes mellitus without complications: Secondary | ICD-10-CM | POA: Diagnosis not present

## 2024-05-14 DIAGNOSIS — I130 Hypertensive heart and chronic kidney disease with heart failure and stage 1 through stage 4 chronic kidney disease, or unspecified chronic kidney disease: Secondary | ICD-10-CM | POA: Diagnosis not present

## 2024-05-14 DIAGNOSIS — E785 Hyperlipidemia, unspecified: Secondary | ICD-10-CM | POA: Diagnosis not present

## 2024-05-14 DIAGNOSIS — R0989 Other specified symptoms and signs involving the circulatory and respiratory systems: Secondary | ICD-10-CM | POA: Diagnosis not present

## 2024-05-14 DIAGNOSIS — D72829 Elevated white blood cell count, unspecified: Secondary | ICD-10-CM | POA: Diagnosis not present

## 2024-05-14 DIAGNOSIS — N3946 Mixed incontinence: Secondary | ICD-10-CM | POA: Diagnosis not present

## 2024-05-14 DIAGNOSIS — Z79899 Other long term (current) drug therapy: Secondary | ICD-10-CM | POA: Diagnosis not present

## 2024-05-14 DIAGNOSIS — R296 Repeated falls: Secondary | ICD-10-CM | POA: Diagnosis not present

## 2024-05-14 DIAGNOSIS — I5021 Acute systolic (congestive) heart failure: Secondary | ICD-10-CM | POA: Diagnosis not present

## 2024-05-14 DIAGNOSIS — E119 Type 2 diabetes mellitus without complications: Secondary | ICD-10-CM | POA: Diagnosis not present

## 2024-05-14 DIAGNOSIS — F17210 Nicotine dependence, cigarettes, uncomplicated: Secondary | ICD-10-CM | POA: Diagnosis not present

## 2024-05-14 DIAGNOSIS — J811 Chronic pulmonary edema: Secondary | ICD-10-CM | POA: Diagnosis not present

## 2024-05-14 DIAGNOSIS — Z7409 Other reduced mobility: Secondary | ICD-10-CM | POA: Diagnosis not present

## 2024-05-14 DIAGNOSIS — J189 Pneumonia, unspecified organism: Secondary | ICD-10-CM | POA: Diagnosis not present

## 2024-05-14 DIAGNOSIS — I2510 Atherosclerotic heart disease of native coronary artery without angina pectoris: Secondary | ICD-10-CM | POA: Diagnosis not present

## 2024-05-14 DIAGNOSIS — E538 Deficiency of other specified B group vitamins: Secondary | ICD-10-CM | POA: Diagnosis not present

## 2024-05-14 DIAGNOSIS — R0602 Shortness of breath: Secondary | ICD-10-CM | POA: Diagnosis not present

## 2024-05-14 DIAGNOSIS — M810 Age-related osteoporosis without current pathological fracture: Secondary | ICD-10-CM | POA: Diagnosis not present

## 2024-05-14 DIAGNOSIS — Z7902 Long term (current) use of antithrombotics/antiplatelets: Secondary | ICD-10-CM | POA: Diagnosis not present

## 2024-05-14 DIAGNOSIS — I517 Cardiomegaly: Secondary | ICD-10-CM | POA: Diagnosis not present

## 2024-05-14 DIAGNOSIS — R2681 Unsteadiness on feet: Secondary | ICD-10-CM | POA: Diagnosis not present

## 2024-05-14 DIAGNOSIS — I447 Left bundle-branch block, unspecified: Secondary | ICD-10-CM | POA: Diagnosis not present

## 2024-05-15 DIAGNOSIS — J479 Bronchiectasis, uncomplicated: Secondary | ICD-10-CM | POA: Diagnosis not present

## 2024-05-15 DIAGNOSIS — R918 Other nonspecific abnormal finding of lung field: Secondary | ICD-10-CM | POA: Diagnosis not present

## 2024-05-15 DIAGNOSIS — D72829 Elevated white blood cell count, unspecified: Secondary | ICD-10-CM | POA: Diagnosis not present

## 2024-05-15 DIAGNOSIS — F17210 Nicotine dependence, cigarettes, uncomplicated: Secondary | ICD-10-CM | POA: Diagnosis not present
